# Patient Record
Sex: Female | Born: 1960 | Race: White | Hispanic: No | Marital: Married | Smoking: Never smoker
Health system: Southern US, Community
[De-identification: ages and names within clinical notes are randomized; demographics above are authoritative.]

## PROBLEM LIST (undated history)

## (undated) DIAGNOSIS — F429 Obsessive-compulsive disorder, unspecified: Secondary | ICD-10-CM

## (undated) DIAGNOSIS — I10 Essential (primary) hypertension: Secondary | ICD-10-CM

## (undated) DIAGNOSIS — R51 Headache: Secondary | ICD-10-CM

## (undated) DIAGNOSIS — F419 Anxiety disorder, unspecified: Secondary | ICD-10-CM

## (undated) DIAGNOSIS — E039 Hypothyroidism, unspecified: Secondary | ICD-10-CM

## (undated) DIAGNOSIS — K130 Diseases of lips: Secondary | ICD-10-CM

## (undated) DIAGNOSIS — R519 Headache, unspecified: Secondary | ICD-10-CM

## (undated) HISTORY — PX: SHOULDER ARTHROSCOPY: SHX128

## (undated) HISTORY — PX: APPENDECTOMY: SHX54

## (undated) HISTORY — PX: LIPOMA EXCISION: SHX5283

## (undated) HISTORY — PX: ABDOMINAL HYSTERECTOMY: SHX81

## (undated) SURGERY — EXCISION MASS
Anesthesia: General | Laterality: Right

---

## 2013-04-04 ENCOUNTER — Other Ambulatory Visit: Payer: Self-pay | Admitting: Family Medicine

## 2013-04-04 DIAGNOSIS — Z1231 Encounter for screening mammogram for malignant neoplasm of breast: Secondary | ICD-10-CM

## 2013-04-23 ENCOUNTER — Other Ambulatory Visit: Payer: Self-pay | Admitting: Gastroenterology

## 2013-04-23 ENCOUNTER — Ambulatory Visit
Admission: RE | Admit: 2013-04-23 | Discharge: 2013-04-23 | Disposition: A | Discharge status: Home / Self Care | Payer: BC Managed Care – PPO | Source: Ambulatory Visit | Attending: Gastroenterology | Admitting: Gastroenterology

## 2013-04-23 DIAGNOSIS — R109 Unspecified abdominal pain: Secondary | ICD-10-CM

## 2013-04-23 MED ORDER — IOHEXOL 300 MG/ML  SOLN
100.0000 mL | Freq: Once | INTRAMUSCULAR | Status: AC | PRN
  Administered 2013-04-23: 100 mL via INTRAVENOUS

## 2013-05-02 ENCOUNTER — Ambulatory Visit
Admission: RE | Admit: 2013-05-02 | Discharge: 2013-05-02 | Disposition: A | Discharge status: Home / Self Care | Payer: BC Managed Care – PPO | Source: Ambulatory Visit | Attending: Family Medicine | Admitting: Family Medicine

## 2013-05-02 DIAGNOSIS — Z1231 Encounter for screening mammogram for malignant neoplasm of breast: Secondary | ICD-10-CM

## 2014-08-08 ENCOUNTER — Other Ambulatory Visit: Payer: Self-pay

## 2014-08-08 DIAGNOSIS — Z1231 Encounter for screening mammogram for malignant neoplasm of breast: Secondary | ICD-10-CM

## 2014-09-02 ENCOUNTER — Ambulatory Visit
Admission: RE | Admit: 2014-09-02 | Discharge: 2014-09-02 | Disposition: A | Discharge status: Home / Self Care | Payer: BC Managed Care – PPO | Source: Ambulatory Visit

## 2014-09-02 DIAGNOSIS — Z1231 Encounter for screening mammogram for malignant neoplasm of breast: Secondary | ICD-10-CM

## 2015-10-13 ENCOUNTER — Other Ambulatory Visit: Payer: Self-pay

## 2015-10-13 DIAGNOSIS — Z1231 Encounter for screening mammogram for malignant neoplasm of breast: Secondary | ICD-10-CM

## 2015-11-06 ENCOUNTER — Ambulatory Visit
Admission: RE | Admit: 2015-11-06 | Discharge: 2015-11-06 | Disposition: A | Discharge status: Home / Self Care | Payer: BLUE CROSS/BLUE SHIELD | Source: Ambulatory Visit

## 2015-11-06 DIAGNOSIS — Z1231 Encounter for screening mammogram for malignant neoplasm of breast: Secondary | ICD-10-CM

## 2015-11-25 ENCOUNTER — Encounter (HOSPITAL_BASED_OUTPATIENT_CLINIC_OR_DEPARTMENT_OTHER): Payer: Self-pay | Admitting: *Deleted

## 2015-11-27 ENCOUNTER — Encounter (HOSPITAL_BASED_OUTPATIENT_CLINIC_OR_DEPARTMENT_OTHER)
Admission: RE | Admit: 2015-11-27 | Discharge: 2015-11-27 | Disposition: A | Discharge status: Home / Self Care | Payer: BLUE CROSS/BLUE SHIELD | Source: Ambulatory Visit | Attending: Specialist | Admitting: Specialist

## 2015-11-27 ENCOUNTER — Other Ambulatory Visit: Payer: Self-pay

## 2015-11-27 DIAGNOSIS — Z01812 Encounter for preprocedural laboratory examination: Secondary | ICD-10-CM | POA: Insufficient documentation

## 2015-11-27 DIAGNOSIS — Z01810 Encounter for preprocedural cardiovascular examination: Secondary | ICD-10-CM | POA: Diagnosis not present

## 2015-11-27 DIAGNOSIS — I10 Essential (primary) hypertension: Secondary | ICD-10-CM | POA: Diagnosis not present

## 2015-11-27 LAB — BASIC METABOLIC PANEL
ANION GAP: 11 (ref 5–15)
BUN: 7 mg/dL (ref 6–20)
CALCIUM: 9.9 mg/dL (ref 8.9–10.3)
CO2: 32 mmol/L (ref 22–32)
CREATININE: 0.86 mg/dL (ref 0.44–1.00)
Chloride: 99 mmol/L — ABNORMAL LOW (ref 101–111)
GFR calc Af Amer: >60 mL/min (ref >60)
GLUCOSE: 91 mg/dL (ref 65–99)
Potassium: 4 mmol/L (ref 3.5–5.1)
Sodium: 142 mmol/L (ref 135–145)

## 2015-11-28 ENCOUNTER — Ambulatory Visit: Payer: Self-pay | Admitting: Specialist

## 2015-12-01 ENCOUNTER — Ambulatory Visit (HOSPITAL_BASED_OUTPATIENT_CLINIC_OR_DEPARTMENT_OTHER): Payer: BLUE CROSS/BLUE SHIELD | Admitting: Anesthesiology

## 2015-12-01 ENCOUNTER — Encounter (HOSPITAL_BASED_OUTPATIENT_CLINIC_OR_DEPARTMENT_OTHER): Payer: Self-pay | Admitting: *Deleted

## 2015-12-01 ENCOUNTER — Ambulatory Visit (HOSPITAL_BASED_OUTPATIENT_CLINIC_OR_DEPARTMENT_OTHER)
Admission: RE | Admit: 2015-12-01 | Discharge: 2015-12-01 | Disposition: A | Discharge status: Home / Self Care | Payer: BLUE CROSS/BLUE SHIELD | Source: Ambulatory Visit | Attending: Specialist | Admitting: Specialist

## 2015-12-01 ENCOUNTER — Encounter (HOSPITAL_BASED_OUTPATIENT_CLINIC_OR_DEPARTMENT_OTHER)
Admission: RE | Disposition: A | Discharge status: Home / Self Care | Payer: Self-pay | Source: Ambulatory Visit | Attending: Specialist

## 2015-12-01 DIAGNOSIS — Q279 Congenital malformation of peripheral vascular system, unspecified: Secondary | ICD-10-CM | POA: Diagnosis present

## 2015-12-01 DIAGNOSIS — F419 Anxiety disorder, unspecified: Secondary | ICD-10-CM | POA: Insufficient documentation

## 2015-12-01 DIAGNOSIS — Z79899 Other long term (current) drug therapy: Secondary | ICD-10-CM | POA: Insufficient documentation

## 2015-12-01 DIAGNOSIS — Z90710 Acquired absence of both cervix and uterus: Secondary | ICD-10-CM | POA: Insufficient documentation

## 2015-12-01 DIAGNOSIS — E039 Hypothyroidism, unspecified: Secondary | ICD-10-CM | POA: Diagnosis not present

## 2015-12-01 DIAGNOSIS — R4681 Obsessive-compulsive behavior: Secondary | ICD-10-CM | POA: Insufficient documentation

## 2015-12-01 DIAGNOSIS — I10 Essential (primary) hypertension: Secondary | ICD-10-CM | POA: Diagnosis not present

## 2015-12-01 HISTORY — DX: Essential (primary) hypertension: I10

## 2015-12-01 HISTORY — DX: Diseases of lips: K13.0

## 2015-12-01 HISTORY — DX: Headache, unspecified: R51.9

## 2015-12-01 HISTORY — DX: Hypothyroidism, unspecified: E03.9

## 2015-12-01 HISTORY — DX: Anxiety disorder, unspecified: F41.9

## 2015-12-01 HISTORY — DX: Headache: R51

## 2015-12-01 HISTORY — PX: LESION REMOVAL: SHX5196

## 2015-12-01 HISTORY — DX: Obsessive-compulsive disorder, unspecified: F42.9

## 2015-12-01 SURGERY — WIDE EXCISION, LESION, UPPER EXTREMITY
Anesthesia: General | Laterality: Right

## 2015-12-01 MED ORDER — FENTANYL CITRATE (PF) 100 MCG/2ML IJ SOLN
INTRAMUSCULAR | Status: AC
Start: 2015-12-01 — End: 2015-12-01
  Filled 2015-12-01: qty 2

## 2015-12-01 MED ORDER — GLYCOPYRROLATE 0.2 MG/ML IJ SOLN
INTRAMUSCULAR | Status: AC
  Filled 2015-12-01: qty 1

## 2015-12-01 MED ORDER — DEXAMETHASONE SODIUM PHOSPHATE 10 MG/ML IJ SOLN
INTRAMUSCULAR | Status: AC
  Filled 2015-12-01: qty 1

## 2015-12-01 MED ORDER — LIDOCAINE-EPINEPHRINE 0.5 %-1:200000 IJ SOLN
INTRAMUSCULAR | Status: DC | PRN
  Administered 2015-12-01: 5 mL

## 2015-12-01 MED ORDER — SCOPOLAMINE 1 MG/3DAYS TD PT72: 1.0000 | MEDICATED_PATCH | Freq: Once | TRANSDERMAL | Status: DC | PRN

## 2015-12-01 MED ORDER — MIDAZOLAM HCL 2 MG/2ML IJ SOLN
INTRAMUSCULAR | Status: AC
  Filled 2015-12-01: qty 2

## 2015-12-01 MED ORDER — PROPOFOL 10 MG/ML IV BOLUS
INTRAVENOUS | Status: DC | PRN
  Administered 2015-12-01: 120 mg via INTRAVENOUS

## 2015-12-01 MED ORDER — GLYCOPYRROLATE 0.2 MG/ML IJ SOLN
0.2000 mg | Freq: Once | INTRAMUSCULAR | Status: AC | PRN
  Administered 2015-12-01: 0.2 mg via INTRAVENOUS

## 2015-12-01 MED ORDER — ONDANSETRON HCL 4 MG/2ML IJ SOLN
INTRAMUSCULAR | Status: DC | PRN
  Administered 2015-12-01: 4 mg via INTRAVENOUS

## 2015-12-01 MED ORDER — EPHEDRINE SULFATE 50 MG/ML IJ SOLN
INTRAMUSCULAR | Status: AC
  Filled 2015-12-01: qty 1

## 2015-12-01 MED ORDER — FENTANYL CITRATE (PF) 100 MCG/2ML IJ SOLN
50.0000 ug | INTRAMUSCULAR | Status: DC | PRN
  Administered 2015-12-01: 100 ug via INTRAVENOUS

## 2015-12-01 MED ORDER — CEFAZOLIN SODIUM-DEXTROSE 2-3 GM-% IV SOLR
2.0000 g | INTRAVENOUS | Status: AC
  Administered 2015-12-01: 2 g via INTRAVENOUS

## 2015-12-01 MED ORDER — HYDROMORPHONE HCL 1 MG/ML IJ SOLN
0.2500 mg | INTRAMUSCULAR | Status: DC | PRN
Start: 2015-12-01 — End: 2015-12-01

## 2015-12-01 MED ORDER — CEFAZOLIN SODIUM-DEXTROSE 2-3 GM-% IV SOLR
INTRAVENOUS | Status: AC
Start: 2015-12-01 — End: 2015-12-01
  Filled 2015-12-01: qty 50

## 2015-12-01 MED ORDER — SUCCINYLCHOLINE CHLORIDE 20 MG/ML IJ SOLN
INTRAMUSCULAR | Status: DC | PRN
  Administered 2015-12-01: 80 mg via INTRAVENOUS

## 2015-12-01 MED ORDER — PROPOFOL 10 MG/ML IV BOLUS
INTRAVENOUS | Status: AC
  Filled 2015-12-01: qty 20

## 2015-12-01 MED ORDER — LACTATED RINGERS IV SOLN
INTRAVENOUS | Status: DC
  Administered 2015-12-01: 09:00:00 via INTRAVENOUS

## 2015-12-01 MED ORDER — DEXAMETHASONE SODIUM PHOSPHATE 4 MG/ML IJ SOLN
INTRAMUSCULAR | Status: DC | PRN
  Administered 2015-12-01: 10 mg via INTRAVENOUS

## 2015-12-01 MED ORDER — ONDANSETRON HCL 4 MG/2ML IJ SOLN
INTRAMUSCULAR | Status: AC
  Filled 2015-12-01: qty 2

## 2015-12-01 MED ORDER — MIDAZOLAM HCL 2 MG/2ML IJ SOLN
1.0000 mg | INTRAMUSCULAR | Status: DC | PRN
  Administered 2015-12-01: 2 mg via INTRAVENOUS

## 2015-12-01 MED ORDER — LIDOCAINE HCL (CARDIAC) 20 MG/ML IV SOLN
INTRAVENOUS | Status: AC
  Filled 2015-12-01: qty 5

## 2015-12-01 MED ORDER — LIDOCAINE HCL (CARDIAC) 20 MG/ML IV SOLN
INTRAVENOUS | Status: DC | PRN
  Administered 2015-12-01: 50 mg via INTRAVENOUS

## 2015-12-01 MED ORDER — EPHEDRINE SULFATE 50 MG/ML IJ SOLN
INTRAMUSCULAR | Status: DC | PRN
  Administered 2015-12-01 (×3): 10 mg via INTRAVENOUS

## 2015-12-01 SURGICAL SUPPLY — 49 items
BAG DECANTER FOR FLEXI CONT (MISCELLANEOUS) ×3 IMPLANT
BLADE HEX COATED 2.75 (ELECTRODE) IMPLANT
BLADE KNIFE PERSONA 10 (BLADE) IMPLANT
BLADE KNIFE PERSONA 15 (BLADE) ×3 IMPLANT
BNDG GAUZE ELAST 4 BULKY (GAUZE/BANDAGES/DRESSINGS) IMPLANT
CANISTER SUCT 1200ML W/VALVE (MISCELLANEOUS) IMPLANT
COVER BACK TABLE 60X90IN (DRAPES) ×3 IMPLANT
COVER MAYO STAND STRL (DRAPES) ×3 IMPLANT
DECANTER SPIKE VIAL GLASS SM (MISCELLANEOUS) IMPLANT
DRAPE LAPAROSCOPIC ABDOMINAL (DRAPES) IMPLANT
DRAPE U-SHAPE 76X120 STRL (DRAPES) ×3 IMPLANT
DRSG PAD ABDOMINAL 8X10 ST (GAUZE/BANDAGES/DRESSINGS) IMPLANT
ELECT REM PT RETURN 9FT ADLT (ELECTROSURGICAL) ×3
ELECTRODE REM PT RTRN 9FT ADLT (ELECTROSURGICAL) ×1 IMPLANT
GAUZE SPONGE 4X4 12PLY STRL (GAUZE/BANDAGES/DRESSINGS) IMPLANT
GAUZE XEROFORM 1X8 LF (GAUZE/BANDAGES/DRESSINGS) IMPLANT
GAUZE XEROFORM 5X9 LF (GAUZE/BANDAGES/DRESSINGS) IMPLANT
GLOVE BIO SURGEON STRL SZ 6.5 (GLOVE) ×2 IMPLANT
GLOVE BIO SURGEONS STRL SZ 6.5 (GLOVE) ×1
GLOVE BIOGEL M STRL SZ7.5 (GLOVE) ×3 IMPLANT
GLOVE BIOGEL PI IND STRL 8 (GLOVE) ×1 IMPLANT
GLOVE BIOGEL PI INDICATOR 8 (GLOVE) ×2
GLOVE ECLIPSE 7.0 STRL STRAW (GLOVE) ×3 IMPLANT
GOWN STRL REUS W/ TWL LRG LVL3 (GOWN DISPOSABLE) IMPLANT
GOWN STRL REUS W/ TWL XL LVL3 (GOWN DISPOSABLE) ×2 IMPLANT
GOWN STRL REUS W/TWL LRG LVL3 (GOWN DISPOSABLE)
GOWN STRL REUS W/TWL XL LVL3 (GOWN DISPOSABLE) ×4
NEEDLE HYPO 25X1 1.5 SAFETY (NEEDLE) ×3 IMPLANT
NEEDLE SPNL 18GX3.5 QUINCKE PK (NEEDLE) IMPLANT
NS IRRIG 1000ML POUR BTL (IV SOLUTION) ×3 IMPLANT
PACK BASIN DAY SURGERY FS (CUSTOM PROCEDURE TRAY) ×3 IMPLANT
PENCIL BUTTON HOLSTER BLD 10FT (ELECTRODE) ×3 IMPLANT
SHEET MEDIUM DRAPE 40X70 STRL (DRAPES) IMPLANT
SLEEVE SCD COMPRESS KNEE MED (MISCELLANEOUS) ×3 IMPLANT
SPONGE GAUZE 4X4 12PLY STER LF (GAUZE/BANDAGES/DRESSINGS) IMPLANT
SPONGE LAP 18X18 X RAY DECT (DISPOSABLE) ×3 IMPLANT
STRIP SUTURE WOUND CLOSURE 1/2 (SUTURE) IMPLANT
SUCTION FRAZIER HANDLE 10FR (MISCELLANEOUS) ×2
SUCTION TUBE FRAZIER 10FR DISP (MISCELLANEOUS) ×1 IMPLANT
SUT ETHILON 5 0 PS 2 18 (SUTURE) ×3 IMPLANT
SUT ETHILON 6 0 PS 3 18 (SUTURE) ×3 IMPLANT
SUT MON AB 5-0 PS2 18 (SUTURE) ×3 IMPLANT
SYR 20CC LL (SYRINGE) ×3 IMPLANT
SYR 50ML LL SCALE MARK (SYRINGE) IMPLANT
SYR CONTROL 10ML LL (SYRINGE) IMPLANT
TOWEL OR 17X24 6PK STRL BLUE (TOWEL DISPOSABLE) ×6 IMPLANT
TUBE CONNECTING 20'X1/4 (TUBING) ×1
TUBE CONNECTING 20X1/4 (TUBING) ×2 IMPLANT
VAC PENCILS W/TUBING CLEAR (MISCELLANEOUS) IMPLANT

## 2015-12-01 NOTE — Anesthesia Preprocedure Evaluation (Addendum)
Anesthesia Evaluation  Patient identified by MRN, date of birth, ID band Patient awake    Reviewed: Allergy & Precautions, H&P , NPO status , Patient's Chart, lab work & pertinent test results  Airway Mallampati: I  TM Distance: >3 FB     Dental no notable dental hx. (+) Teeth Intact, Dental Advisory Given   Pulmonary neg pulmonary ROS,    Pulmonary exam normal breath sounds clear to auscultation       Cardiovascular hypertension, Pt. on medications  Rhythm:Regular Rate:Normal     Neuro/Psych  Headaches, Anxiety    GI/Hepatic negative GI ROS, Neg liver ROS,   Endo/Other  Hypothyroidism   Renal/GU negative Renal ROS  negative genitourinary   Musculoskeletal   Abdominal   Peds  Hematology negative hematology ROS (+)   Anesthesia Other Findings   Reproductive/Obstetrics negative OB ROS                            Anesthesia Physical Anesthesia Plan  ASA: II  Anesthesia Plan: General   Post-op Pain Management:    Induction: Intravenous  Airway Management Planned: Oral ETT  Additional Equipment:   Intra-op Plan:   Post-operative Plan: Extubation in OR  Informed Consent: I have reviewed the patients History and Physical, chart, labs and discussed the procedure including the risks, benefits and alternatives for the proposed anesthesia with the patient or authorized representative who has indicated his/her understanding and acceptance.   Dental advisory given  Plan Discussed with: CRNA  Anesthesia Plan Comments:         Anesthesia Quick Evaluation

## 2015-12-01 NOTE — Brief Op Note (Signed)
12/01/2015  10:14 AM  PATIENT:  Destiny Bennett  55 y.o. female  PRE-OPERATIVE DIAGNOSIS:  LESION RIGHT LOWER LIP  POST-OPERATIVE DIAGNOSIS:  LESION RIGHT LOWER LIP  PROCEDURE:  Procedure(s) with comments: EXCISION LESION RIGHT LOWER LIP (Right) - right lower lip   SURGEON:  Surgeon(s) and Role:    * Louisa SecondGerald Jailine Lieder, MD - Primary  PHYSICIAN ASSISTANT:   ASSISTANTS: none   ANESTHESIA:   general  EBL:  Total I/O In: 1500 [I.V.:1500] Out: 5 [Blood:5]  BLOOD ADMINISTERED:none  DRAINS: none   LOCAL MEDICATIONS USED:  LIDOCAINE   SPECIMEN:  Excision  DISPOSITION OF SPECIMEN:  PATHOLOGY  COUNTS:  YES  TOURNIQUET:  * No tourniquets in log *  DICTATION: .Other Dictation: Dictation Number [id number]  PLAN OF CARE: Discharge to home after PACU  PATIENT DISPOSITION:  PACU - hemodynamically stable.   Delay start of Pharmacological VTE agent (>24hrs) due to surgical blood loss or risk of bleeding: yes

## 2015-12-01 NOTE — Anesthesia Procedure Notes (Signed)
Procedure Name: Intubation Performed by: Bruk Tumolo W Pre-anesthesia Checklist: Patient identified, Emergency Drugs available, Suction available and Patient being monitored Patient Re-evaluated:Patient Re-evaluated prior to inductionOxygen Delivery Method: Circle System Utilized Preoxygenation: Pre-oxygenation with 100% oxygen Intubation Type: IV induction Ventilation: Mask ventilation without difficulty Laryngoscope Size: Miller and 2 Grade View: Grade I Tube type: Oral Number of attempts: 1 Airway Equipment and Method: Stylet and Oral airway Placement Confirmation: ETT inserted through vocal cords under direct vision,  positive ETCO2 and breath sounds checked- equal and bilateral Secured at: 22 cm Tube secured with: Tape Dental Injury: Teeth and Oropharynx as per pre-operative assessment      

## 2015-12-01 NOTE — Transfer of Care (Signed)
Immediate Anesthesia Transfer of Care Note  Patient: Destiny KirschnerJana Bennett  Procedure(s) Performed: Procedure(s) with comments: EXCISION LESION RIGHT LOWER LIP (Right) - right lower lip   Patient Location: PACU  Anesthesia Type:General  Level of Consciousness: awake and sedated  Airway & Oxygen Therapy: Patient Spontanous Breathing and Patient connected to face mask oxygen  Post-op Assessment: Report given to RN and Post -op Vital signs reviewed and stable  Post vital signs: Reviewed and stable  Last Vitals:  Filed Vitals:   12/01/15 0839  BP: 146/75  Pulse: 60  Temp: 36.8 C  Resp: 20    Complications: No apparent anesthesia complications

## 2015-12-01 NOTE — Discharge Instructions (Signed)
Activity (include date of return to work if known) °As tolerated: NO showers °NO driving °No heavy activities ° °Diet:regular No restrictions: ° °Wound Care: Keep dressing clean & dry ° °Do not change dressings °For Abdominoplasties wear abdominal binder °Special Instructions: °Do not raise arms up °Continue to empty, recharge, & record drainage from J-P drains &/or °Hemovacs 2-3 times a day, as needed. °Call Doctor if any unusual problems occur such as pain, excessive °Bleeding, unrelieved Nausea/vomiting, Fever &/or chills °When lying down, keep head elevated on 2-3 pillows or back-rest °For Addominoplasties the Jack-knife position °Follow-up appointment: Please call the office. ° °The patient received discharge instruction from:___________________________________________ ° ° °Patient signature ________________________________________ / Date___________ ° ° ° °Signature of individual providing instructions/ Date________________     ° ° °Post Anesthesia Home Care Instructions ° °Activity: °Get plenty of rest for the remainder of the day. A responsible adult should stay with you for 24 hours following the procedure.  °For the next 24 hours, DO NOT: °-Drive a car °-Operate machinery °-Drink alcoholic beverages °-Take any medication unless instructed by your physician °-Make any legal decisions or sign important papers. ° °Meals: °Start with liquid foods such as gelatin or soup. Progress to regular foods as tolerated. Avoid greasy, spicy, heavy foods. If nausea and/or vomiting occur, drink only clear liquids until the nausea and/or vomiting subsides. Call your physician if vomiting continues. ° °Special Instructions/Symptoms: °Your throat may feel dry or sore from the anesthesia or the breathing tube placed in your throat during surgery. If this causes discomfort, gargle with warm salt water. The discomfort should disappear within 24 hours. ° °If you had a scopolamine patch placed behind your ear for the management of  post- operative nausea and/or vomiting: ° °1. The medication in the patch is effective for 72 hours, after which it should be removed.  Wrap patch in a tissue and discard in the trash. Wash hands thoroughly with soap and water. °2. You may remove the patch earlier than 72 hours if you experience unpleasant side effects which may include dry mouth, dizziness or visual disturbances. °3. Avoid touching the patch. Wash your hands with soap and water after contact with the patch. °  °         °

## 2015-12-01 NOTE — Anesthesia Postprocedure Evaluation (Signed)
Anesthesia Post Note  Patient: Destiny Bennett  Procedure(s) Performed: Procedure(s) (LRB): EXCISION LESION RIGHT LOWER LIP (Right)  Patient location during evaluation: PACU Anesthesia Type: General Level of consciousness: awake and alert Pain management: pain level controlled Vital Signs Assessment: post-procedure vital signs reviewed and stable Respiratory status: spontaneous breathing, nonlabored ventilation and respiratory function stable Cardiovascular status: blood pressure returned to baseline and stable Postop Assessment: no signs of nausea or vomiting Anesthetic complications: no    Last Vitals:  Filed Vitals:   12/01/15 1104 12/01/15 1145  BP:  143/86  Pulse: 95 92  Temp:  36 C  Resp: 16 14    Last Pain: There were no vitals filed for this visit.               Luvern Mcisaac,W. EDMOND

## 2015-12-01 NOTE — H&P (Signed)
Destiny KirschnerJana Bennett is an 55 y.o. female.   Chief Complaint: Vascular lesion to the right lower lip HPI: Increased growth and vascularization  Has also bled in the past  Past Medical History  Diagnosis Date  . Hypertension   . Hypothyroidism   . Anxiety   . OCD (obsessive compulsive disorder)   . Headache   . Lesion of lip     right lower lip    Past Surgical History  Procedure Laterality Date  . Abdominal hysterectomy    . Cesarean section      x2  . Appendectomy    . Shoulder arthroscopy Left   . Lipoma excision      left elbow    History reviewed. No pertinent family history. Social History:  reports that she has never smoked. She does not have any smokeless tobacco history on file. She reports that she drinks alcohol. She reports that she does not use illicit drugs.  Allergies: No Known Allergies  Medications Prior to Admission  Medication Sig Dispense Refill  . Biotin 10 MG CAPS Take by mouth.    . citalopram (CELEXA) 40 MG tablet Take 40 mg by mouth daily.    . hydrochlorothiazide (MICROZIDE) 12.5 MG capsule Take 12.5 mg by mouth daily.    Marland Kitchen. levothyroxine (SYNTHROID, LEVOTHROID) 50 MCG tablet Take 50 mcg by mouth daily before breakfast.    . Multiple Vitamin (MULTIVITAMIN WITH MINERALS) TABS tablet Take 1 tablet by mouth daily.    . temazepam (RESTORIL) 30 MG capsule Take 30 mg by mouth at bedtime as needed for sleep.    . SUMAtriptan (IMITREX) 50 MG tablet Take 50 mg by mouth every 2 (two) hours as needed for migraine. May repeat in 2 hours if headache persists or recurs.      No results found for this or any previous visit (from the past 48 hour(s)). No results found.  Review of Systems  Constitutional: Negative.   HENT: Negative.   Eyes: Negative.   Respiratory: Negative.   Cardiovascular: Negative.   Gastrointestinal: Negative.   Genitourinary: Negative.   Musculoskeletal: Negative.   Skin: Negative.   Neurological: Negative.   Endo/Heme/Allergies: Negative.    Psychiatric/Behavioral: Negative.     Blood pressure 146/75, pulse 60, temperature 98.2 F (36.8 C), temperature source Oral, resp. rate 20, height 5\' 6"  (1.676 m), weight 63.504 kg (140 lb), SpO2 99 %. Physical Exam   Assessment/Plan Vascular lesion right lower lip rule out AV malformation or hemangioma  Ediberto Sens L, MD 12/01/2015, 9:00 AM

## 2015-12-02 ENCOUNTER — Encounter (HOSPITAL_BASED_OUTPATIENT_CLINIC_OR_DEPARTMENT_OTHER): Payer: Self-pay | Admitting: Specialist

## 2015-12-02 NOTE — Op Note (Signed)
NAMElmon Kirschner:  Hoheisel, Breanne                ACCOUNT NO.:  [id number]  MEDICAL RECORD NO.:  [id number]  LOCATION:                                 FACILITY:  PHYSICIAN:  Earvin HansenGerald L. 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  PROCEDURES DONE:  Excision, flap reconstruction of the area.  ANESTHESIA:  General.  Preoperatively, the patient underwent drawings of the areas to include the mucosa through the vermillion border.  She underwent general anesthesia, intubated orally.  Prep was done to the face with Betadine solution, walled off with sterile towels and drapes so as to make a sterile field.  Marking pen was used to outline the region, had to go below the vermillion border, approximately 2 mm of 0.5% of Xylocaine with epinephrine was injected locally.  Total of 6 mL 1:100,000 concentration.  This was allowed to set up and then elliptical collared construction incision was made down to underlying brachioradialis oculus with proper margins of the lesion.  After this, hemostasis was maintained with the Bovie anticoagulation at 20 __________.  Next, the brachioradialis was reclosed back with deep sutures of 5-0 Monocryl, subcutaneous closed on a 5-0 Monocryl throughout the deep subcutaneous areas.  Skin was reapproximated with multiple sutures of 6-0 nylon throughout the area.  Small vascular lesion just to the midline of the lesion was then Bovie'd with Bovie anticoagulation x1 pass.  All this seemed to be a very superficial and surface area and location.  The wounds were cleansed.  Steri-Strips and soft dressings were applied to all the areas.  She withstood the procedures very well, and was taken to recovery in excellent  condition.     Yaakov GuthrieGerald L. Shon Houghruesdale, M.D.     Cathie HoopsGLT/MEDQ  D:  12/01/2015  T:  12/01/2015  Job:  [id number]

## 2020-06-09 DIAGNOSIS — T300 Burn of unspecified body region, unspecified degree: Secondary | ICD-10-CM | POA: Diagnosis not present

## 2020-06-23 DIAGNOSIS — T300 Burn of unspecified body region, unspecified degree: Secondary | ICD-10-CM | POA: Diagnosis not present

## 2020-06-23 DIAGNOSIS — F5101 Primary insomnia: Secondary | ICD-10-CM | POA: Diagnosis not present

## 2020-07-10 DIAGNOSIS — S52501A Unspecified fracture of the lower end of right radius, initial encounter for closed fracture: Secondary | ICD-10-CM | POA: Diagnosis not present

## 2020-07-10 DIAGNOSIS — S6991XA Unspecified injury of right wrist, hand and finger(s), initial encounter: Secondary | ICD-10-CM | POA: Diagnosis not present

## 2020-07-15 DIAGNOSIS — S52501D Unspecified fracture of the lower end of right radius, subsequent encounter for closed fracture with routine healing: Secondary | ICD-10-CM | POA: Diagnosis not present

## 2020-07-25 DIAGNOSIS — Z79899 Other long term (current) drug therapy: Secondary | ICD-10-CM | POA: Diagnosis not present

## 2020-07-25 DIAGNOSIS — E039 Hypothyroidism, unspecified: Secondary | ICD-10-CM | POA: Diagnosis not present

## 2020-07-25 DIAGNOSIS — Z20828 Contact with and (suspected) exposure to other viral communicable diseases: Secondary | ICD-10-CM | POA: Diagnosis not present

## 2020-07-25 DIAGNOSIS — I10 Essential (primary) hypertension: Secondary | ICD-10-CM | POA: Diagnosis not present

## 2020-07-25 DIAGNOSIS — E782 Mixed hyperlipidemia: Secondary | ICD-10-CM | POA: Diagnosis not present

## 2020-08-01 DIAGNOSIS — E782 Mixed hyperlipidemia: Secondary | ICD-10-CM | POA: Diagnosis not present

## 2020-08-01 DIAGNOSIS — I10 Essential (primary) hypertension: Secondary | ICD-10-CM | POA: Diagnosis not present

## 2020-08-01 DIAGNOSIS — E039 Hypothyroidism, unspecified: Secondary | ICD-10-CM | POA: Diagnosis not present

## 2020-08-06 DIAGNOSIS — S52501D Unspecified fracture of the lower end of right radius, subsequent encounter for closed fracture with routine healing: Secondary | ICD-10-CM | POA: Diagnosis not present

## 2020-08-28 DIAGNOSIS — S52501D Unspecified fracture of the lower end of right radius, subsequent encounter for closed fracture with routine healing: Secondary | ICD-10-CM | POA: Diagnosis not present

## 2020-10-01 DIAGNOSIS — Z20822 Contact with and (suspected) exposure to covid-19: Secondary | ICD-10-CM | POA: Diagnosis not present

## 2021-01-15 DIAGNOSIS — F5101 Primary insomnia: Secondary | ICD-10-CM | POA: Diagnosis not present

## 2021-01-15 DIAGNOSIS — F3342 Major depressive disorder, recurrent, in full remission: Secondary | ICD-10-CM | POA: Diagnosis not present

## 2021-01-15 DIAGNOSIS — F418 Other specified anxiety disorders: Secondary | ICD-10-CM | POA: Diagnosis not present

## 2021-01-30 DIAGNOSIS — Z79899 Other long term (current) drug therapy: Secondary | ICD-10-CM | POA: Diagnosis not present

## 2021-01-30 DIAGNOSIS — E785 Hyperlipidemia, unspecified: Secondary | ICD-10-CM | POA: Diagnosis not present

## 2021-01-30 DIAGNOSIS — I10 Essential (primary) hypertension: Secondary | ICD-10-CM | POA: Diagnosis not present

## 2021-01-30 DIAGNOSIS — E039 Hypothyroidism, unspecified: Secondary | ICD-10-CM | POA: Diagnosis not present

## 2021-02-06 DIAGNOSIS — F418 Other specified anxiety disorders: Secondary | ICD-10-CM | POA: Diagnosis not present

## 2021-02-06 DIAGNOSIS — E782 Mixed hyperlipidemia: Secondary | ICD-10-CM | POA: Diagnosis not present

## 2021-02-06 DIAGNOSIS — I10 Essential (primary) hypertension: Secondary | ICD-10-CM | POA: Diagnosis not present

## 2021-02-06 DIAGNOSIS — E039 Hypothyroidism, unspecified: Secondary | ICD-10-CM | POA: Diagnosis not present

## 2021-07-24 DIAGNOSIS — Z1159 Encounter for screening for other viral diseases: Secondary | ICD-10-CM | POA: Diagnosis not present

## 2021-07-24 DIAGNOSIS — E039 Hypothyroidism, unspecified: Secondary | ICD-10-CM | POA: Diagnosis not present

## 2021-07-24 DIAGNOSIS — I10 Essential (primary) hypertension: Secondary | ICD-10-CM | POA: Diagnosis not present

## 2021-07-24 DIAGNOSIS — Z79899 Other long term (current) drug therapy: Secondary | ICD-10-CM | POA: Diagnosis not present

## 2021-07-24 DIAGNOSIS — E785 Hyperlipidemia, unspecified: Secondary | ICD-10-CM | POA: Diagnosis not present

## 2021-08-12 DIAGNOSIS — F418 Other specified anxiety disorders: Secondary | ICD-10-CM | POA: Diagnosis not present

## 2021-08-12 DIAGNOSIS — E782 Mixed hyperlipidemia: Secondary | ICD-10-CM | POA: Diagnosis not present

## 2021-08-12 DIAGNOSIS — I10 Essential (primary) hypertension: Secondary | ICD-10-CM | POA: Diagnosis not present

## 2021-08-12 DIAGNOSIS — E039 Hypothyroidism, unspecified: Secondary | ICD-10-CM | POA: Diagnosis not present

## 2021-08-12 DIAGNOSIS — R110 Nausea: Secondary | ICD-10-CM | POA: Diagnosis not present

## 2021-09-02 DIAGNOSIS — M545 Low back pain, unspecified: Secondary | ICD-10-CM | POA: Diagnosis not present

## 2021-10-13 DIAGNOSIS — M7671 Peroneal tendinitis, right leg: Secondary | ICD-10-CM | POA: Diagnosis not present

## 2021-11-25 DIAGNOSIS — I10 Essential (primary) hypertension: Secondary | ICD-10-CM | POA: Diagnosis not present

## 2021-11-25 DIAGNOSIS — F3342 Major depressive disorder, recurrent, in full remission: Secondary | ICD-10-CM | POA: Diagnosis not present

## 2021-11-25 DIAGNOSIS — G4701 Insomnia due to medical condition: Secondary | ICD-10-CM | POA: Diagnosis not present

## 2021-12-04 ENCOUNTER — Emergency Department (HOSPITAL_COMMUNITY): Payer: BC Managed Care – PPO

## 2021-12-04 ENCOUNTER — Encounter (HOSPITAL_COMMUNITY): Payer: Self-pay

## 2021-12-04 ENCOUNTER — Inpatient Hospital Stay (HOSPITAL_COMMUNITY)
Admission: EM | Admit: 2021-12-04 | Discharge: 2021-12-09 | DRG: 460 | Disposition: A | Discharge status: Home / Self Care | Payer: BC Managed Care – PPO | Attending: Neurosurgery | Admitting: Neurosurgery

## 2021-12-04 ENCOUNTER — Other Ambulatory Visit: Payer: Self-pay

## 2021-12-04 DIAGNOSIS — F429 Obsessive-compulsive disorder, unspecified: Secondary | ICD-10-CM | POA: Diagnosis present

## 2021-12-04 DIAGNOSIS — Y92410 Unspecified street and highway as the place of occurrence of the external cause: Secondary | ICD-10-CM | POA: Diagnosis not present

## 2021-12-04 DIAGNOSIS — R519 Headache, unspecified: Secondary | ICD-10-CM | POA: Diagnosis not present

## 2021-12-04 DIAGNOSIS — S0990XA Unspecified injury of head, initial encounter: Secondary | ICD-10-CM | POA: Diagnosis not present

## 2021-12-04 DIAGNOSIS — S199XXA Unspecified injury of neck, initial encounter: Secondary | ICD-10-CM | POA: Diagnosis not present

## 2021-12-04 DIAGNOSIS — R339 Retention of urine, unspecified: Secondary | ICD-10-CM | POA: Diagnosis present

## 2021-12-04 DIAGNOSIS — S22009A Unspecified fracture of unspecified thoracic vertebra, initial encounter for closed fracture: Secondary | ICD-10-CM | POA: Diagnosis not present

## 2021-12-04 DIAGNOSIS — Z79890 Hormone replacement therapy (postmenopausal): Secondary | ICD-10-CM

## 2021-12-04 DIAGNOSIS — I700 Atherosclerosis of aorta: Secondary | ICD-10-CM | POA: Diagnosis not present

## 2021-12-04 DIAGNOSIS — S22008A Other fracture of unspecified thoracic vertebra, initial encounter for closed fracture: Secondary | ICD-10-CM

## 2021-12-04 DIAGNOSIS — S32011A Stable burst fracture of first lumbar vertebra, initial encounter for closed fracture: Secondary | ICD-10-CM | POA: Diagnosis not present

## 2021-12-04 DIAGNOSIS — I10 Essential (primary) hypertension: Secondary | ICD-10-CM | POA: Diagnosis present

## 2021-12-04 DIAGNOSIS — Z20822 Contact with and (suspected) exposure to covid-19: Secondary | ICD-10-CM | POA: Diagnosis present

## 2021-12-04 DIAGNOSIS — E039 Hypothyroidism, unspecified: Secondary | ICD-10-CM | POA: Diagnosis present

## 2021-12-04 DIAGNOSIS — F419 Anxiety disorder, unspecified: Secondary | ICD-10-CM | POA: Diagnosis present

## 2021-12-04 DIAGNOSIS — Z041 Encounter for examination and observation following transport accident: Secondary | ICD-10-CM | POA: Diagnosis not present

## 2021-12-04 DIAGNOSIS — M4325 Fusion of spine, thoracolumbar region: Secondary | ICD-10-CM | POA: Diagnosis not present

## 2021-12-04 DIAGNOSIS — J9811 Atelectasis: Secondary | ICD-10-CM | POA: Diagnosis not present

## 2021-12-04 DIAGNOSIS — E876 Hypokalemia: Secondary | ICD-10-CM | POA: Diagnosis not present

## 2021-12-04 DIAGNOSIS — S22089A Unspecified fracture of T11-T12 vertebra, initial encounter for closed fracture: Secondary | ICD-10-CM | POA: Diagnosis not present

## 2021-12-04 DIAGNOSIS — S32001A Stable burst fracture of unspecified lumbar vertebra, initial encounter for closed fracture: Secondary | ICD-10-CM | POA: Diagnosis not present

## 2021-12-04 DIAGNOSIS — S32010A Wedge compression fracture of first lumbar vertebra, initial encounter for closed fracture: Principal | ICD-10-CM

## 2021-12-04 DIAGNOSIS — Z981 Arthrodesis status: Secondary | ICD-10-CM | POA: Diagnosis not present

## 2021-12-04 DIAGNOSIS — T1490XA Injury, unspecified, initial encounter: Secondary | ICD-10-CM

## 2021-12-04 DIAGNOSIS — R52 Pain, unspecified: Secondary | ICD-10-CM

## 2021-12-04 LAB — COMPREHENSIVE METABOLIC PANEL
ALT: 19 U/L (ref 0–44)
AST: 26 U/L (ref 15–41)
Albumin: 4.1 g/dL (ref 3.5–5.0)
Alkaline Phosphatase: 80 U/L (ref 38–126)
Anion gap: 13 (ref 5–15)
BUN: 8 mg/dL (ref 6–20)
CO2: 22 mmol/L (ref 22–32)
Calcium: 8.9 mg/dL (ref 8.9–10.3)
Chloride: 103 mmol/L (ref 98–111)
Creatinine, Ser: 0.85 mg/dL (ref 0.44–1.00)
GFR, Estimated: >60 mL/min (ref >60)
Glucose, Bld: 134 mg/dL — ABNORMAL HIGH (ref 70–99)
Potassium: 2.6 mmol/L — CL (ref 3.5–5.1)
Sodium: 138 mmol/L (ref 135–145)
Total Bilirubin: 1.2 mg/dL (ref 0.3–1.2)
Total Protein: 7.1 g/dL (ref 6.5–8.1)

## 2021-12-04 LAB — ETHANOL: Alcohol, Ethyl (B): 44 mg/dL — ABNORMAL HIGH (ref <10)

## 2021-12-04 LAB — CBC
HCT: 38.8 % (ref 36.0–46.0)
Hemoglobin: 13.3 g/dL (ref 12.0–15.0)
MCH: 30.4 pg (ref 26.0–34.0)
MCHC: 34.3 g/dL (ref 30.0–36.0)
MCV: 88.6 fL (ref 80.0–100.0)
Platelets: 239 10*3/uL (ref 150–400)
RBC: 4.38 MIL/uL (ref 3.87–5.11)
RDW: 12.7 % (ref 11.5–15.5)
WBC: 12.6 10*3/uL — ABNORMAL HIGH (ref 4.0–10.5)
nRBC: 0 % (ref 0.0–0.2)

## 2021-12-04 MED ORDER — FENTANYL CITRATE PF 50 MCG/ML IJ SOSY
50.0000 ug | PREFILLED_SYRINGE | Freq: Once | INTRAMUSCULAR | Status: AC
  Administered 2021-12-04: 50 ug via INTRAVENOUS
  Filled 2021-12-04: qty 1

## 2021-12-04 MED ORDER — POTASSIUM CHLORIDE 10 MEQ/100ML IV SOLN
10.0000 meq | INTRAVENOUS | Status: DC
  Administered 2021-12-04 – 2021-12-05 (×4): 10 meq via INTRAVENOUS
  Filled 2021-12-04 (×4): qty 100
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
## 2021-12-04 NOTE — ED Notes (Signed)
Dr Fredricka Bonine with Trauma Surgery paged to [facility] ?
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
## 2021-12-05 DIAGNOSIS — F419 Anxiety disorder, unspecified: Secondary | ICD-10-CM | POA: Diagnosis present

## 2021-12-05 DIAGNOSIS — F429 Obsessive-compulsive disorder, unspecified: Secondary | ICD-10-CM | POA: Diagnosis present

## 2021-12-05 DIAGNOSIS — R519 Headache, unspecified: Secondary | ICD-10-CM | POA: Diagnosis present

## 2021-12-05 DIAGNOSIS — Y92410 Unspecified street and highway as the place of occurrence of the external cause: Secondary | ICD-10-CM | POA: Diagnosis not present

## 2021-12-05 DIAGNOSIS — S32011A Stable burst fracture of first lumbar vertebra, initial encounter for closed fracture: Secondary | ICD-10-CM | POA: Diagnosis present

## 2021-12-05 DIAGNOSIS — R339 Retention of urine, unspecified: Secondary | ICD-10-CM | POA: Diagnosis present

## 2021-12-05 DIAGNOSIS — E039 Hypothyroidism, unspecified: Secondary | ICD-10-CM | POA: Diagnosis present

## 2021-12-05 DIAGNOSIS — E876 Hypokalemia: Secondary | ICD-10-CM | POA: Diagnosis present

## 2021-12-05 DIAGNOSIS — Z20822 Contact with and (suspected) exposure to covid-19: Secondary | ICD-10-CM | POA: Diagnosis present

## 2021-12-05 DIAGNOSIS — S32001A Stable burst fracture of unspecified lumbar vertebra, initial encounter for closed fracture: Secondary | ICD-10-CM | POA: Diagnosis present

## 2021-12-05 DIAGNOSIS — I10 Essential (primary) hypertension: Secondary | ICD-10-CM | POA: Diagnosis present

## 2021-12-05 DIAGNOSIS — Z79890 Hormone replacement therapy (postmenopausal): Secondary | ICD-10-CM | POA: Diagnosis not present

## 2021-12-05 DIAGNOSIS — S22089A Unspecified fracture of T11-T12 vertebra, initial encounter for closed fracture: Secondary | ICD-10-CM | POA: Diagnosis present

## 2021-12-05 LAB — BASIC METABOLIC PANEL
Anion gap: 10 (ref 5–15)
BUN: 7 mg/dL (ref 6–20)
CO2: 24 mmol/L (ref 22–32)
Calcium: 9 mg/dL (ref 8.9–10.3)
Chloride: 101 mmol/L (ref 98–111)
Creatinine, Ser: 0.94 mg/dL (ref 0.44–1.00)
GFR, Estimated: >60 mL/min (ref >60)
Glucose, Bld: 133 mg/dL — ABNORMAL HIGH (ref 70–99)
Potassium: 4 mmol/L (ref 3.5–5.1)
Sodium: 135 mmol/L (ref 135–145)

## 2021-12-05 LAB — CBC
HCT: 38.9 % (ref 36.0–46.0)
Hemoglobin: 13.6 g/dL (ref 12.0–15.0)
MCH: 30.6 pg (ref 26.0–34.0)
MCHC: 35 g/dL (ref 30.0–36.0)
MCV: 87.4 fL (ref 80.0–100.0)
Platelets: 216 10*3/uL (ref 150–400)
RBC: 4.45 MIL/uL (ref 3.87–5.11)
RDW: 12.6 % (ref 11.5–15.5)
WBC: 15.9 10*3/uL — ABNORMAL HIGH (ref 4.0–10.5)
nRBC: 0 % (ref 0.0–0.2)

## 2021-12-05 LAB — HIV ANTIBODY (ROUTINE TESTING W REFLEX): HIV Screen 4th Generation wRfx: NONREACTIVE

## 2021-12-05 LAB — RESP PANEL BY RT-PCR (FLU A&B, COVID) ARPGX2
Influenza A by PCR: NEGATIVE
Influenza B by PCR: NEGATIVE
SARS Coronavirus 2 by RT PCR: NEGATIVE

## 2021-12-05 MED ORDER — ONDANSETRON HCL 4 MG/2ML IJ SOLN: 4.0000 mg | Freq: Four times a day (QID) | INTRAMUSCULAR | Status: DC | PRN

## 2021-12-05 MED ORDER — SODIUM CHLORIDE 0.9 % IV SOLN: INTRAVENOUS | Status: DC

## 2021-12-05 MED ORDER — ACETAMINOPHEN 500 MG PO TABS
1000.0000 mg | ORAL_TABLET | Freq: Four times a day (QID) | ORAL | Status: DC
Start: 2021-12-05 — End: 2021-12-06
  Administered 2021-12-05 – 2021-12-06 (×6): 1000 mg via ORAL
  Filled 2021-12-05 (×6): qty 2

## 2021-12-05 MED ORDER — ONDANSETRON 4 MG PO TBDP: 4.0000 mg | ORAL_TABLET | Freq: Four times a day (QID) | ORAL | Status: DC | PRN

## 2021-12-05 MED ORDER — OXYCODONE HCL 5 MG PO TABS
10.0000 mg | ORAL_TABLET | ORAL | Status: DC | PRN
  Administered 2021-12-05 – 2021-12-06 (×6): 10 mg via ORAL
  Filled 2021-12-05 (×7): qty 2

## 2021-12-05 MED ORDER — HYDROMORPHONE HCL 1 MG/ML IJ SOLN
1.0000 mg | INTRAMUSCULAR | Status: DC | PRN
  Administered 2021-12-05 (×3): 1 mg via INTRAVENOUS
  Filled 2021-12-05 (×3): qty 1

## 2021-12-05 MED ORDER — DOCUSATE SODIUM 100 MG PO CAPS
100.0000 mg | ORAL_CAPSULE | Freq: Two times a day (BID) | ORAL | Status: DC
  Administered 2021-12-05 – 2021-12-06 (×3): 100 mg via ORAL
  Filled 2021-12-05 (×3): qty 1

## 2021-12-05 MED ORDER — HYDROMORPHONE HCL 1 MG/ML IJ SOLN
1.0000 mg | Freq: Once | INTRAMUSCULAR | Status: AC
  Administered 2021-12-05: 1 mg via INTRAVENOUS
  Filled 2021-12-05: qty 1

## 2021-12-05 MED ORDER — HYDRALAZINE HCL 20 MG/ML IJ SOLN
10.0000 mg | INTRAMUSCULAR | Status: DC | PRN
  Filled 2021-12-05: qty 1

## 2021-12-05 MED ORDER — METOPROLOL TARTRATE 5 MG/5ML IV SOLN
5.0000 mg | Freq: Four times a day (QID) | INTRAVENOUS | Status: DC | PRN
  Filled 2021-12-05: qty 5

## 2021-12-05 MED ORDER — METHOCARBAMOL 1000 MG/10ML IJ SOLN
500.0000 mg | Freq: Four times a day (QID) | INTRAVENOUS | Status: DC | PRN
  Filled 2021-12-05 (×3): qty 5

## 2021-12-05 MED ORDER — TRAMADOL HCL 50 MG PO TABS
50.0000 mg | ORAL_TABLET | Freq: Four times a day (QID) | ORAL | Status: DC | PRN
  Administered 2021-12-05 (×3): 50 mg via ORAL
  Filled 2021-12-05 (×3): qty 1

## 2021-12-05 MED ORDER — BISACODYL 10 MG RE SUPP: 10.0000 mg | Freq: Every day | RECTAL | Status: DC | PRN

## 2021-12-05 MED ORDER — OXYCODONE HCL 5 MG PO TABS: 5.0000 mg | ORAL_TABLET | ORAL | Status: DC | PRN

## 2021-12-05 MED ORDER — GABAPENTIN 300 MG PO CAPS
300.0000 mg | ORAL_CAPSULE | Freq: Three times a day (TID) | ORAL | Status: DC
  Administered 2021-12-05 – 2021-12-09 (×13): 300 mg via ORAL
  Filled 2021-12-05 (×14): qty 1
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
## 2021-12-05 NOTE — TOC CAGE-AID Note (Signed)
Transition of Care (TOC) - CAGE-AID Screening ? ? ?Patient Details  ?Name: Destiny Bennett ?MRN: [id number] ?Date of Birth: 01/26/1961 ?Kaiyden Simkin, Collier Bullock, RN ?Phone Number: ?12/05/2021, 1:56 AM ? ? ?Clinical Narrative: ?Pt reports occasional etoh, denies tobacco, street drug usage. Declines need for resources.  ? ? ?CAGE-AID Screening: ?  ? ?Have You Ever Felt You Ought to Cut Down on Your Drinking or Drug Use?: No ?Have People Annoyed You By Critizing Your Drinking Or Drug Use?: No ?Have You Felt Bad Or Guilty About Your Drinking Or Drug Use?: No ?Have You Ever Had a Drink or Used Drugs First Thing In The Morning to Steady Your Nerves or to Get Rid of a Hangover?: No ?CAGE-AID Score: 0 ? ?Substance Abuse Education Offered: Yes ? ?  ? ? ? ? ? ? ?
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
## 2021-12-06 ENCOUNTER — Other Ambulatory Visit: Payer: Self-pay

## 2021-12-06 ENCOUNTER — Inpatient Hospital Stay (HOSPITAL_COMMUNITY): Payer: BC Managed Care – PPO | Admitting: Certified Registered Nurse Anesthetist

## 2021-12-06 ENCOUNTER — Encounter (HOSPITAL_COMMUNITY): Payer: Self-pay

## 2021-12-06 ENCOUNTER — Encounter (HOSPITAL_COMMUNITY)
Admission: EM | Disposition: A | Discharge status: Home / Self Care | Payer: Self-pay | Source: Home / Self Care | Attending: Neurosurgery

## 2021-12-06 ENCOUNTER — Inpatient Hospital Stay (HOSPITAL_COMMUNITY): Payer: BC Managed Care – PPO

## 2021-12-06 DIAGNOSIS — S32001A Stable burst fracture of unspecified lumbar vertebra, initial encounter for closed fracture: Secondary | ICD-10-CM | POA: Diagnosis present

## 2021-12-06 LAB — ABO/RH: ABO/RH(D): O POS

## 2021-12-06 LAB — TYPE AND SCREEN
ABO/RH(D): O POS
Antibody Screen: NEGATIVE

## 2021-12-06 LAB — SURGICAL PCR SCREEN
MRSA, PCR: NEGATIVE
Staphylococcus aureus: POSITIVE — AB

## 2021-12-06 SURGERY — LUMBAR PERCUTANEOUS PEDICLE SCREW 1 LEVEL
Anesthesia: General | Site: Back

## 2021-12-06 MED ORDER — ARTIFICIAL TEARS OPHTHALMIC OINT
TOPICAL_OINTMENT | OPHTHALMIC | Status: DC | PRN
  Administered 2021-12-06: 1 via OPHTHALMIC

## 2021-12-06 MED ORDER — SODIUM CHLORIDE 0.9% FLUSH: 3.0000 mL | INTRAVENOUS | Status: DC | PRN

## 2021-12-06 MED ORDER — LIDOCAINE-EPINEPHRINE 0.5 %-1:200000 IJ SOLN
INTRAMUSCULAR | Status: DC | PRN
  Administered 2021-12-06: 5 mL via INTRADERMAL

## 2021-12-06 MED ORDER — BUPIVACAINE HCL (PF) 0.5 % IJ SOLN
INTRAMUSCULAR | Status: AC
  Filled 2021-12-06: qty 30

## 2021-12-06 MED ORDER — THROMBIN 20000 UNITS EX SOLR
CUTANEOUS | Status: DC | PRN
  Administered 2021-12-06: 20 mL via TOPICAL

## 2021-12-06 MED ORDER — SODIUM CHLORIDE 0.9 % IV SOLN: 250.0000 mL | INTRAVENOUS | Status: DC

## 2021-12-06 MED ORDER — ACETAMINOPHEN 325 MG PO TABS: 650.0000 mg | ORAL_TABLET | ORAL | Status: DC | PRN

## 2021-12-06 MED ORDER — FENTANYL CITRATE (PF) 250 MCG/5ML IJ SOLN
INTRAMUSCULAR | Status: AC
  Filled 2021-12-06: qty 5

## 2021-12-06 MED ORDER — CEFAZOLIN SODIUM-DEXTROSE 2-4 GM/100ML-% IV SOLN
2.0000 g | Freq: Once | INTRAVENOUS | Status: AC
  Administered 2021-12-06: 2 g via INTRAVENOUS

## 2021-12-06 MED ORDER — LEVOTHYROXINE SODIUM 50 MCG PO TABS
50.0000 ug | ORAL_TABLET | Freq: Every day | ORAL | Status: DC
  Administered 2021-12-07 – 2021-12-09 (×3): 50 ug via ORAL
  Filled 2021-12-06 (×3): qty 1

## 2021-12-06 MED ORDER — LIDOCAINE-EPINEPHRINE 0.5 %-1:200000 IJ SOLN
INTRAMUSCULAR | Status: AC
  Filled 2021-12-06: qty 1

## 2021-12-06 MED ORDER — CEFAZOLIN SODIUM-DEXTROSE 2-4 GM/100ML-% IV SOLN
INTRAVENOUS | Status: AC
  Filled 2021-12-06: qty 100

## 2021-12-06 MED ORDER — THROMBIN 5000 UNITS EX SOLR
CUTANEOUS | Status: AC
  Filled 2021-12-06: qty 10000

## 2021-12-06 MED ORDER — BUPIVACAINE HCL (PF) 0.5 % IJ SOLN
INTRAMUSCULAR | Status: DC | PRN
  Administered 2021-12-06 (×2): 15 mL

## 2021-12-06 MED ORDER — MIDAZOLAM HCL 2 MG/2ML IJ SOLN
INTRAMUSCULAR | Status: AC
  Filled 2021-12-06: qty 2

## 2021-12-06 MED ORDER — SUGAMMADEX SODIUM 200 MG/2ML IV SOLN
INTRAVENOUS | Status: DC | PRN
  Administered 2021-12-06: 200 mg via INTRAVENOUS

## 2021-12-06 MED ORDER — PROPOFOL 10 MG/ML IV BOLUS
INTRAVENOUS | Status: DC | PRN
  Administered 2021-12-06: 150 mg via INTRAVENOUS

## 2021-12-06 MED ORDER — LIDOCAINE HCL (CARDIAC) PF 50 MG/5ML IV SOSY
PREFILLED_SYRINGE | INTRAVENOUS | Status: DC | PRN
  Administered 2021-12-06: 100 mg via INTRAVENOUS

## 2021-12-06 MED ORDER — ZOLPIDEM TARTRATE 5 MG PO TABS
5.0000 mg | ORAL_TABLET | Freq: Every evening | ORAL | Status: DC | PRN
  Administered 2021-12-07 – 2021-12-08 (×2): 5 mg via ORAL
  Filled 2021-12-06 (×2): qty 1

## 2021-12-06 MED ORDER — THROMBIN 20000 UNITS EX SOLR
CUTANEOUS | Status: AC
  Filled 2021-12-06: qty 20000

## 2021-12-06 MED ORDER — HYDRALAZINE HCL 20 MG/ML IJ SOLN
INTRAMUSCULAR | Status: AC
Start: 2021-12-06 — End: 2021-12-06
  Administered 2021-12-06: 5 mg via INTRAVENOUS
  Filled 2021-12-06: qty 1

## 2021-12-06 MED ORDER — GELATIN ABSORBABLE MT POWD
OROMUCOSAL | Status: DC | PRN
  Administered 2021-12-06 (×2): 5 mL via TOPICAL

## 2021-12-06 MED ORDER — HYDRALAZINE HCL 20 MG/ML IJ SOLN
5.0000 mg | Freq: Once | INTRAMUSCULAR | Status: AC
  Administered 2021-12-06: 5 mg via INTRAVENOUS

## 2021-12-06 MED ORDER — DEXAMETHASONE SODIUM PHOSPHATE 4 MG/ML IJ SOLN
INTRAMUSCULAR | Status: DC | PRN
  Administered 2021-12-06: 10 mg via INTRAVENOUS

## 2021-12-06 MED ORDER — ONDANSETRON HCL 4 MG/2ML IJ SOLN
INTRAMUSCULAR | Status: DC | PRN
  Administered 2021-12-06: 4 mg via INTRAVENOUS

## 2021-12-06 MED ORDER — MENTHOL 3 MG MT LOZG: 1.0000 | LOZENGE | OROMUCOSAL | Status: DC | PRN

## 2021-12-06 MED ORDER — LACTATED RINGERS IV SOLN: INTRAVENOUS | Status: DC

## 2021-12-06 MED ORDER — PHENYLEPHRINE HCL (PRESSORS) 10 MG/ML IV SOLN
INTRAVENOUS | Status: DC | PRN
  Administered 2021-12-06: 120 ug via INTRAVENOUS

## 2021-12-06 MED ORDER — MIDAZOLAM HCL 5 MG/5ML IJ SOLN
INTRAMUSCULAR | Status: DC | PRN
  Administered 2021-12-06: 2 mg via INTRAVENOUS

## 2021-12-06 MED ORDER — OXYCODONE HCL ER 15 MG PO T12A
15.0000 mg | EXTENDED_RELEASE_TABLET | Freq: Two times a day (BID) | ORAL | Status: DC
  Administered 2021-12-06 – 2021-12-09 (×6): 15 mg via ORAL
  Filled 2021-12-06 (×6): qty 1

## 2021-12-06 MED ORDER — FENTANYL CITRATE (PF) 100 MCG/2ML IJ SOLN
INTRAMUSCULAR | Status: DC | PRN
  Administered 2021-12-06: 100 ug via INTRAVENOUS
  Administered 2021-12-06 (×3): 50 ug via INTRAVENOUS

## 2021-12-06 MED ORDER — HYDRALAZINE HCL 20 MG/ML IJ SOLN: 5.0000 mg | Freq: Once | INTRAMUSCULAR | Status: AC

## 2021-12-06 MED ORDER — 0.9 % SODIUM CHLORIDE (POUR BTL) OPTIME
TOPICAL | Status: DC | PRN
  Administered 2021-12-06: 1000 mL

## 2021-12-06 MED ORDER — CHLORHEXIDINE GLUCONATE 0.12 % MT SOLN: 15.0000 mL | Freq: Once | OROMUCOSAL | Status: AC

## 2021-12-06 MED ORDER — PHENOL 1.4 % MT LIQD: 1.0000 | OROMUCOSAL | Status: DC | PRN

## 2021-12-06 MED ORDER — DIAZEPAM 5 MG PO TABS
5.0000 mg | ORAL_TABLET | Freq: Four times a day (QID) | ORAL | Status: DC | PRN
  Administered 2021-12-08 – 2021-12-09 (×5): 5 mg via ORAL
  Filled 2021-12-06 (×5): qty 1

## 2021-12-06 MED ORDER — CITALOPRAM HYDROBROMIDE 40 MG PO TABS
40.0000 mg | ORAL_TABLET | Freq: Every day | ORAL | Status: DC
  Administered 2021-12-07 – 2021-12-09 (×3): 40 mg via ORAL
  Filled 2021-12-06 (×3): qty 1

## 2021-12-06 MED ORDER — HYDROCHLOROTHIAZIDE 12.5 MG PO TABS
12.5000 mg | ORAL_TABLET | Freq: Every day | ORAL | Status: DC
  Administered 2021-12-07 – 2021-12-09 (×3): 12.5 mg via ORAL
  Filled 2021-12-06 (×3): qty 1

## 2021-12-06 MED ORDER — FENTANYL CITRATE (PF) 100 MCG/2ML IJ SOLN: 25.0000 ug | INTRAMUSCULAR | Status: DC | PRN

## 2021-12-06 MED ORDER — CHLORHEXIDINE GLUCONATE 0.12 % MT SOLN
OROMUCOSAL | Status: AC
Start: 2021-12-06 — End: 2021-12-06
  Administered 2021-12-06: 15 mL via OROMUCOSAL
  Filled 2021-12-06: qty 15

## 2021-12-06 MED ORDER — CHLORHEXIDINE GLUCONATE CLOTH 2 % EX PADS
6.0000 | MEDICATED_PAD | Freq: Every day | CUTANEOUS | Status: DC
  Administered 2021-12-07 – 2021-12-09 (×3): 6 via TOPICAL

## 2021-12-06 MED ORDER — SUMATRIPTAN SUCCINATE 50 MG PO TABS
50.0000 mg | ORAL_TABLET | ORAL | Status: DC | PRN
  Administered 2021-12-07: 50 mg via ORAL
  Filled 2021-12-06 (×2): qty 1

## 2021-12-06 MED ORDER — OXYCODONE HCL 5 MG PO TABS
5.0000 mg | ORAL_TABLET | ORAL | Status: DC | PRN
  Administered 2021-12-07 (×2): 5 mg via ORAL
  Filled 2021-12-06: qty 1

## 2021-12-06 MED ORDER — ROCURONIUM BROMIDE 100 MG/10ML IV SOLN
INTRAVENOUS | Status: DC | PRN
  Administered 2021-12-06 (×2): 10 mg via INTRAVENOUS
  Administered 2021-12-06: 50 mg via INTRAVENOUS
  Administered 2021-12-06: 5 mg via INTRAVENOUS

## 2021-12-06 MED ORDER — THROMBIN 5000 UNITS EX SOLR
CUTANEOUS | Status: AC
  Filled 2021-12-06: qty 5000

## 2021-12-06 MED ORDER — ACETAMINOPHEN 500 MG PO TABS
1000.0000 mg | ORAL_TABLET | Freq: Four times a day (QID) | ORAL | Status: AC
  Administered 2021-12-06 – 2021-12-07 (×4): 1000 mg via ORAL
  Filled 2021-12-06 (×4): qty 2

## 2021-12-06 MED ORDER — POTASSIUM CHLORIDE IN NACL 20-0.9 MEQ/L-% IV SOLN
INTRAVENOUS | Status: DC
  Filled 2021-12-06 (×2): qty 1000

## 2021-12-06 MED ORDER — ORAL CARE MOUTH RINSE: 15.0000 mL | Freq: Once | OROMUCOSAL | Status: AC

## 2021-12-06 MED ORDER — SODIUM CHLORIDE 0.9% FLUSH
3.0000 mL | Freq: Two times a day (BID) | INTRAVENOUS | Status: DC
  Administered 2021-12-07 – 2021-12-08 (×2): 3 mL via INTRAVENOUS

## 2021-12-06 MED ORDER — PHENYLEPHRINE HCL-NACL 20-0.9 MG/250ML-% IV SOLN
INTRAVENOUS | Status: DC | PRN
  Administered 2021-12-06: 40 ug/min via INTRAVENOUS

## 2021-12-06 MED ORDER — ACETAMINOPHEN 650 MG RE SUPP: 650.0000 mg | RECTAL | Status: DC | PRN

## 2021-12-06 MED ORDER — SUCCINYLCHOLINE CHLORIDE 200 MG/10ML IV SOSY
PREFILLED_SYRINGE | INTRAVENOUS | Status: DC | PRN
  Administered 2021-12-06: 110 mg via INTRAVENOUS

## 2021-12-06 SURGICAL SUPPLY — 74 items
BAG COUNTER SPONGE SURGICOUNT (BAG) ×3 IMPLANT
BASKET BONE COLLECTION (BASKET) ×1 IMPLANT
BENZOIN TINCTURE PRP APPL 2/3 (GAUZE/BANDAGES/DRESSINGS) IMPLANT
BLADE CLIPPER SURG (BLADE) IMPLANT
BLADE SURG 10 STRL SS (BLADE) ×1 IMPLANT
BUR MATCHSTICK NEURO 3.0 LAGG (BURR) ×2 IMPLANT
BUR PRECISION FLUTE 5.0 (BURR) ×2 IMPLANT
CANISTER SUCT 3000ML PPV (MISCELLANEOUS) ×2 IMPLANT
CARTRIDGE OIL MAESTRO DRILL (MISCELLANEOUS) ×1 IMPLANT
CNTNR URN SCR LID CUP LEK RST (MISCELLANEOUS) ×1 IMPLANT
CONT SPEC 4OZ STRL OR WHT (MISCELLANEOUS) ×1
COVER BACK TABLE 60X90IN (DRAPES) ×2 IMPLANT
DECANTER SPIKE VIAL GLASS SM (MISCELLANEOUS) ×2 IMPLANT
DERMABOND ADVANCED (GAUZE/BANDAGES/DRESSINGS) ×2
DERMABOND ADVANCED .7 DNX12 (GAUZE/BANDAGES/DRESSINGS) ×1 IMPLANT
DIFFUSER DRILL AIR PNEUMATIC (MISCELLANEOUS) ×2 IMPLANT
DRAPE C-ARM 42X72 X-RAY (DRAPES) ×4 IMPLANT
DRAPE C-ARMOR (DRAPES) ×1 IMPLANT
DRAPE LAPAROTOMY 100X72X124 (DRAPES) ×2 IMPLANT
DRAPE SURG 17X23 STRL (DRAPES) ×2 IMPLANT
DRSG OPSITE POSTOP 4X10 (GAUZE/BANDAGES/DRESSINGS) ×1 IMPLANT
DURAPREP 26ML APPLICATOR (WOUND CARE) ×2 IMPLANT
ELECT REM PT RETURN 9FT ADLT (ELECTROSURGICAL) ×2
ELECTRODE REM PT RTRN 9FT ADLT (ELECTROSURGICAL) ×1 IMPLANT
GAUZE 4X4 16PLY ~~LOC~~+RFID DBL (SPONGE) ×3 IMPLANT
GAUZE SPONGE 4X4 12PLY STRL (GAUZE/BANDAGES/DRESSINGS) IMPLANT
GLOVE EXAM NITRILE XL STR (GLOVE) IMPLANT
GLOVE SURG LTX SZ6.5 (GLOVE) ×4 IMPLANT
GLOVE SURG LTX SZ7 (GLOVE) ×4 IMPLANT
GLOVE SURG POLYISO LF SZ6.5 (GLOVE) ×1 IMPLANT
GLOVE SURG UNDER POLY LF SZ7 (GLOVE) ×1 IMPLANT
GLOVE SURG UNDER POLY LF SZ7.5 (GLOVE) ×2 IMPLANT
GOWN STRL REUS W/ TWL LRG LVL3 (GOWN DISPOSABLE) ×2 IMPLANT
GOWN STRL REUS W/ TWL XL LVL3 (GOWN DISPOSABLE) IMPLANT
GOWN STRL REUS W/TWL 2XL LVL3 (GOWN DISPOSABLE) IMPLANT
GOWN STRL REUS W/TWL LRG LVL3 (GOWN DISPOSABLE) ×4
GOWN STRL REUS W/TWL XL LVL3 (GOWN DISPOSABLE)
GRAFT BN 10X1XDBM MAGNIFUSE (Bone Implant) IMPLANT
GRAFT BONE MAGNIFUSE 1X10CM (Bone Implant) ×1 IMPLANT
GUIDEWIRE EVEREST 1.4X620 2-PK (WIRE) ×4 IMPLANT
KIT BASIN OR (CUSTOM PROCEDURE TRAY) ×2 IMPLANT
KIT POSITION SURG JACKSON T1 (MISCELLANEOUS) ×2 IMPLANT
KIT TURNOVER KIT B (KITS) ×2 IMPLANT
MILL MEDIUM DISP (BLADE) ×1 IMPLANT
NDL BIOPSY DD SERENGETI 8G (NEEDLE) IMPLANT
NDL HYPO 25X1 1.5 SAFETY (NEEDLE) ×1 IMPLANT
NDL RETRACTOR BT 8G (NEEDLE) IMPLANT
NDL SPNL 18GX3.5 QUINCKE PK (NEEDLE) IMPLANT
NEEDLE BIOPSY DD SERENGETI 8G (NEEDLE) ×6 IMPLANT
NEEDLE HYPO 25X1 1.5 SAFETY (NEEDLE) ×2 IMPLANT
NEEDLE RETRACTOR BT 8G (NEEDLE) ×2 IMPLANT
NEEDLE SPNL 18GX3.5 QUINCKE PK (NEEDLE) IMPLANT
NS IRRIG 1000ML POUR BTL (IV SOLUTION) ×2 IMPLANT
OIL CARTRIDGE MAESTRO DRILL (MISCELLANEOUS) ×2
PACK LAMINECTOMY NEURO (CUSTOM PROCEDURE TRAY) ×2 IMPLANT
PAD ARMBOARD 7.5X6 YLW CONV (MISCELLANEOUS) ×4 IMPLANT
ROD STRT DENALI 5.5X150 (Rod) ×2 IMPLANT
SCREW CANN PA EVEREST 5.5X40 (Screw) ×5 IMPLANT
SCREW PA FENS EVEREST 6.5X40 (Screw) ×3 IMPLANT
SET SCREW (Screw) ×8 IMPLANT
SET SCREW VRST (Screw) IMPLANT
SPONGE SURGIFOAM ABS GEL 100 (HEMOSTASIS) ×2 IMPLANT
SPONGE T-LAP 4X18 ~~LOC~~+RFID (SPONGE) ×1 IMPLANT
STRIP CLOSURE SKIN 1/2X4 (GAUZE/BANDAGES/DRESSINGS) IMPLANT
SUT PROLENE 6 0 BV (SUTURE) IMPLANT
SUT VIC AB 0 CT1 18XCR BRD8 (SUTURE) ×1 IMPLANT
SUT VIC AB 0 CT1 8-18 (SUTURE) ×2
SUT VIC AB 2-0 CT1 18 (SUTURE) ×4 IMPLANT
SUT VIC AB 3-0 SH 8-18 (SUTURE) ×6 IMPLANT
TOWEL GREEN STERILE (TOWEL DISPOSABLE) ×2 IMPLANT
TOWEL GREEN STERILE FF (TOWEL DISPOSABLE) ×2 IMPLANT
TRAY FOLEY MTR SLVR 16FR STAT (SET/KITS/TRAYS/PACK) ×1 IMPLANT
TRAY FOLEY W/BAG SLVR 14FR (SET/KITS/TRAYS/PACK) ×1 IMPLANT
WATER STERILE IRR 1000ML POUR (IV SOLUTION) ×2 IMPLANT
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
## 2021-12-06 NOTE — Anesthesia Procedure Notes (Signed)
Procedure Name: Intubation ?Date/Time: 12/06/2021 4:15 PM ?Performed by: Edmonia Caprio, CRNA ?Pre-anesthesia Checklist: Patient identified, Emergency Drugs available, Suction available and Patient being monitored ?Patient Re-evaluated:Patient Re-evaluated prior to induction ?Oxygen Delivery Method: Circle system utilized ?Preoxygenation: Pre-oxygenation with 100% oxygen ?Induction Type: IV induction and Rapid sequence ?Laryngoscope Size: Hyacinth Meeker and 2 ?Grade View: Grade I ?Tube type: Oral ?Tube size: 7.0 mm ?Number of attempts: 1 ?Airway Equipment and Method: Stylet ?Placement Confirmation: ETT inserted through vocal cords under direct vision, positive ETCO2 and breath sounds checked- equal and bilateral ?Secured at: 22 cm ?Tube secured with: Tape ?Dental Injury: Teeth and Oropharynx as per pre-operative assessment  ? ? ? ? ?

## 2021-12-06 NOTE — Op Note (Signed)
12/06/2021 ? ?9:04 PM ? ?PATIENT:  Destiny Bennett  61 y.o. female ? ?PRE-OPERATIVE DIAGNOSIS:  Lumbar one Burst Fracture ? ?POST-OPERATIVE DIAGNOSIS:  Lumbar one Burst Fracture ? ?PROCEDURE:  Procedure(s): ?Open Reduction Internal Fixation  Thoracic Eleven - Lumbar Three, Thoracic Twelve - Lumbar Two Posterior Lateral Arthrodesis, LAMINECTOMY Lumbar one for decompression  ?Segmental pedicle screw fixation T11-L3(stryker, percutaneous) ? ?SURGEON: Surgeon(s): ?Ashok Pall, MD ? ?ASSISTANTS:none ? ?ANESTHESIA:   local and general ? ?EBL:  Total I/O ?In: 1000 [I.V.:1000] ?Out: 220 [Urine:70; Blood:150] ? ?BLOOD ADMINISTERED:none ? ?CELL SAVER GIVEN:none ? ?COUNT:per nursing ? ?DRAINS: none  ? ?SPECIMEN:  No Specimen ? ?DICTATION: [name] was taken to the operating room, intubated, and placed under a general anesthetic without difficulty. A foley catheter was placed under sterile conditions She was positioned prone on a Jackson table with all pressure points padded. Her lumbar thoracic region was prepped and draped in a sterile manner. With fluoroscopic guidance pedicle screws were placed in the T11,12,L2, and L3 pedicles in the following fashion. With AP orientation I placed a Jamshidi needle into the vertebral body through the pedicle. The needle path was evaluated both in the AP and lateral planes. Once verified and deemed satisfactory I placed Kwires into the vertebral body through the needle. I placed the screw over the Kwire and screwed it into the vertebral body, checking placement with fluoroscopy. When all screws were placed , I placed a rod through the tulip  heads then secured the rods with locking caps. The construct was assessed with fluoroscopy  and it showed all the hardware to be in good position.  ?I then made a midline incision to expose the L1 lamina. I performed a complete laminectomy of L1 to effect a decompression of the thecal sac and L1, and L2 roots. I used a tamp to push some of the bone  out of the canal anteriorly.  ?I decorticated the lamina of L2 and T11 bilaterally and placed allograft morsels which spanned the L1 disc space.  ?I closed all the wounds in layers approximating the deep fascia, subcutaneous and subcuticular planes with vicryl sutures.  ?I placed a sterile dressing on the closures along with Dermabond. She was rolled onto the OR stretcher, then extubated. She was taken to the [facility] for recovery. ? ?PLAN OF CARE: Admit to inpatient  ? ?PATIENT DISPOSITION:  PACU - hemodynamically stable. ?  ?Delay start of Pharmacological VTE agent (>24hrs) due to surgical blood loss or risk of bleeding:  no ? ?  ?

## 2021-12-06 NOTE — Progress Notes (Signed)
Patient arrived to room 4N14 from PACU.  Assessment complete, VS obtained, ginger ale and graham crackers provided, husband at bedside.  ?

## 2021-12-06 NOTE — Anesthesia Postprocedure Evaluation (Signed)
Anesthesia Post Note ? ?Patient: Destiny Bennett ? ?Procedure(s) Performed: Open Reduction Internal Fixation  Thoracic Eleven - Lumbar Three, Thoracic Twelve - Lumbar Two Posterior Lateral Arthrodesis, LAMINECTOMY Lumbar one (Back) ? ?  ? ?Patient location during evaluation: PACU ?Anesthesia Type: General ?Level of consciousness: awake ?Pain management: pain level controlled ?Respiratory status: spontaneous breathing ?Cardiovascular status: stable ?Postop Assessment: no apparent nausea or vomiting ?Anesthetic complications: no ? ? ?No notable events documented. ? ?Last Vitals:  ?Vitals:  ? 12/06/21 2155 12/06/21 2158  ?BP: (!) 166/91 (!) 158/75  ?Pulse: 85 81  ?Resp:  20  ?Temp: 36.6 ?C 37.1 ?C  ?SpO2: 90% 99%  ?  ?Last Pain:  ?Vitals:  ? 12/06/21 2158  ?TempSrc: Oral  ?PainSc:   ? ? ?  ?  ?  ?  ?  ?  ? ?[name] ? ? ? ? ?

## 2021-12-06 NOTE — Transfer of Care (Signed)
Immediate Anesthesia Transfer of Care Note ? ?Patient: Destiny Bennett ? ?Procedure(s) Performed: Open Reduction Internal Fixation  Thoracic Eleven - Lumbar Three, Thoracic Twelve - Lumbar Two Posterior Lateral Arthrodesis, LAMINECTOMY Lumbar one (Back) ? ?Patient Location: PACU ? ?Anesthesia Type:General ? ?Level of Consciousness: awake, alert  and oriented ? ?Airway & Oxygen Therapy: Patient Spontanous Breathing ? ?Post-op Assessment: Report given to RN, Post -op Vital signs reviewed and stable and Patient moving all extremities X 4 ? ?Post vital signs: Reviewed and stable ? ?Last Vitals:  ?Vitals Value Taken Time  ?BP 146/88 12/06/21 2042  ?Temp    ?Pulse 103 12/06/21 2046  ?Resp 17 12/06/21 2046  ?SpO2 96 % 12/06/21 2046  ?Vitals shown include unvalidated device data. ? ?Last Pain:  ?Vitals:  ? 12/06/21 1444  ?TempSrc: Oral  ?PainSc:   ?   ? ?  ? ?Complications: No notable events documented. ?

## 2021-12-06 NOTE — Anesthesia Preprocedure Evaluation (Addendum)
Anesthesia Evaluation  ?Patient identified by MRN, date of birth, ID band ?Patient awake ? ? ? ?Reviewed: ?Allergy & Precautions, NPO status , Patient's Chart, lab work & pertinent test results ? ?Airway ?Mallampati: II ? ?TM Distance: >3 FB ? ? ? ? Dental ?  ?Pulmonary ? ?  ?breath sounds clear to auscultation ? ? ? ? ? ? Cardiovascular ?hypertension,  ?Rhythm:Regular Rate:Normal ? ? ?  ?Neuro/Psych ? Headaches, PSYCHIATRIC DISORDERS   ? GI/Hepatic ?negative GI ROS, Neg liver ROS,   ?Endo/Other  ?Hypothyroidism  ? Renal/GU ?negative Renal ROS  ? ?  ?Musculoskeletal ? ? Abdominal ?  ?Peds ? Hematology ?  ?Anesthesia Other Findings ? ? Reproductive/Obstetrics ? ?  ? ? ? ? ? ? ? ? ? ? ? ? ? ?  ?  ? ? ? ? ? ? ? ?Anesthesia Physical ?Anesthesia Plan ? ?ASA: 3 ? ?Anesthesia Plan: General  ? ?Post-op Pain Management:   ? ?Induction: Intravenous ? ?PONV Risk Score and Plan: Treatment may vary due to age or medical condition ? ?Airway Management Planned: Oral ETT ? ?Additional Equipment:  ? ?Intra-op Plan:  ? ?Post-operative Plan: Possible Post-op intubation/ventilation ? ?Informed Consent: I have reviewed the patients History and Physical, chart, labs and discussed the procedure including the risks, benefits and alternatives for the proposed anesthesia with the patient or authorized representative who has indicated his/her understanding and acceptance.  ? ? ? ?Dental advisory given ? ?Plan Discussed with: Anesthesiologist and CRNA ? ?Anesthesia Plan Comments:   ? ? ? ? ? ?Anesthesia Quick Evaluation ? ?

## 2021-12-06 NOTE — Progress Notes (Signed)
Patient ID: Destiny Bennett, female   DOB: 11/05/1960, 61 y.o.   MRN: [id number] ?BP (!) 177/85 (BP Location: Left Arm)   Pulse 67   Temp 99 ?F (37.2 ?C) (Oral)   Resp 13   Ht 5\' 6"  (1.676 m)   Wt 76.3 kg   SpO2 97%   BMI 27.15 kg/m?  ?Alert and oriented x 4, speech is clear and fluent ?Moving lower extremities well ?Pain is improved ?Plan on OR later this afternoon. ?

## 2021-12-07 MED ORDER — MUPIROCIN 2 % EX OINT
1.0000 "application " | TOPICAL_OINTMENT | Freq: Two times a day (BID) | CUTANEOUS | Status: DC
  Administered 2021-12-07 – 2021-12-09 (×5): 1 via NASAL
  Filled 2021-12-07 (×2): qty 22

## 2021-12-07 MED ORDER — CHLORHEXIDINE GLUCONATE CLOTH 2 % EX PADS
6.0000 | MEDICATED_PAD | Freq: Every day | CUTANEOUS | Status: DC
  Administered 2021-12-07 – 2021-12-09 (×2): 6 via TOPICAL
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
## 2021-12-07 NOTE — Progress Notes (Signed)
Orthopedic Tech Progress Note ?Patient Details:  ?Destiny Bennett ?May 19, 1961 ?[id number] ? ?Ortho Devices ?Type of Ortho Device: Lumbar corsett ?Ortho Device/Splint Location: delivered to room. ?Ortho Device/Splint Interventions: Ordered ?  ?  ? ?Karolee Stamps ?12/07/2021, 6:07 AM ? ?

## 2021-12-07 NOTE — Progress Notes (Signed)
Patient ID: Destiny Bennett, female   DOB: [birth year], 61 y.o.   MRN: [id number] ?BP (!) 110/59 (BP Location: Left Arm)   Pulse 80   Temp 98.4 ?F (36.9 ?C) (Oral)   Resp 20   Ht 5' 5.98" (1.676 m)   Wt 76.3 kg   SpO2 96%   BMI 27.16 kg/m?  ?Alert and oriented x 4, moving lower extremities well ?Brace is on ?Wound is clean, dry, no signs of infection ?Was up today doing well. Discharge tomorrow ?

## 2021-12-07 NOTE — Progress Notes (Signed)
Mobility Specialist: Progress Note ? ? 12/07/21 1219  ?Mobility  ?Activity Ambulated independently in hallway  ?Level of Assistance Independent after set-up  ?Assistive Device None  ?Distance Ambulated (ft) 520 ft  ?Activity Response Tolerated well  ?$Mobility charge 1 Mobility  ? ?Received pt in chair having no complaints and agreeable to mobility. Asymptomatic throughout ambulation, returned back to chair w/ call bell in reach and all needs met. ? ?Christopher Day ?Mobility Specialist ?Mobility Specialist 5 [address]: [phone] ?Mobility Specialist 6 [address]: [phone] ? ?

## 2021-12-08 ENCOUNTER — Encounter (HOSPITAL_COMMUNITY): Payer: Self-pay | Admitting: Neurosurgery

## 2021-12-08 MED ORDER — TAMSULOSIN HCL 0.4 MG PO CAPS
0.4000 mg | ORAL_CAPSULE | Freq: Two times a day (BID) | ORAL | Status: DC
  Administered 2021-12-08 – 2021-12-09 (×3): 0.4 mg via ORAL
  Filled 2021-12-08 (×3): qty 1
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
## 2021-12-08 NOTE — Progress Notes (Signed)
Bladder scan revealed 568. Pt refused to be I/O cath at this time states she will ambulate again and attempt to void in 1 hour if unsuccessful she will allow I/O cath. Pt educated on importance of straight cath for continued urinary retention she verbalized understanding.  ?

## 2021-12-08 NOTE — Progress Notes (Signed)
Patient ID: Destiny Bennett, female   DOB: 1961-05-10, 61 y.o.   MRN: [id number] ?BP 120/65 (BP Location: Right Arm)   Pulse 93   Temp 99.4 ?F (37.4 ?C) (Oral)   Resp 20   Ht 5' 5.98" (1.676 m)   Wt 76.3 kg   SpO2 90%   BMI 27.16 kg/m?  ?Alert and oriented x 4  ?Speech is clear and fluent ?Moving lower extremities well ?Wound is clean, dry ?Unable to void today, will keep trying ?

## 2021-12-08 NOTE — Progress Notes (Signed)
Mobility Specialist Progress Note: ? ? 12/08/21 1514  ?Mobility  ?Activity Ambulated with assistance in hallway  ?Level of Assistance Contact guard assist, steadying assist  ?Assistive Device  ?(HHA)  ?Distance Ambulated (ft) 600 ft  ?Activity Response Tolerated well  ?$Mobility charge 1 Mobility  ? ?Pt received in bed willing to participate in mobility. No complaints of pain. Pt left in bed with call bell in reach and all needs met.  ? ?Ranette Luckadoo ?Mobility Specialist ?Primary Phone [phone] ? ?

## 2021-12-08 NOTE — Progress Notes (Signed)
Bladder scan revealed 874 ml.  ?I/O cath returned 1000 ml of clear yellow urine.  ? ?

## 2021-12-08 NOTE — Progress Notes (Signed)
1117: Dr. Franky Macho notified pt continues to have urinary retention. See new orders for flomax. Encouraged pt to increase fluid intake. Ambulated to bathroom independently attempted to urinate without success. ? ?1300: Bladder scan reveals 286 ml. Flomax administered. Pt states she is starting to have sensation of urine in bladder states she will attempt to urinate again in the next hour. Fluids encouraged.  ?

## 2021-12-09 MED ORDER — TIZANIDINE HCL 4 MG PO TABS: 4.0000 mg | ORAL_TABLET | Freq: Four times a day (QID) | ORAL | 0 refills | Status: DC | PRN

## 2021-12-09 MED ORDER — OXYCODONE HCL ER 15 MG PO T12A: 15.0000 mg | EXTENDED_RELEASE_TABLET | Freq: Two times a day (BID) | ORAL | 0 refills | Status: AC

## 2021-12-09 MED ORDER — OXYCODONE HCL 5 MG PO TABS: 5.0000 mg | ORAL_TABLET | Freq: Four times a day (QID) | ORAL | 0 refills | Status: AC | PRN

## 2021-12-09 NOTE — Progress Notes (Signed)
Per order foley removed at 1230. Pt due to void by 1830.  ?

## 2021-12-09 NOTE — Progress Notes (Signed)
Called Dr. Franky Macho in regards to foley and discharge. Ordered to remove foley and see if patient can void. MD to decide on discharge later today. Will continue to monitor pt closely. Jillyn Hidden, RN ?

## 2021-12-09 NOTE — Progress Notes (Signed)
Pt was discharged from facility at this time all written and verbal instruction given to patient. Pt was escorted via wheel chair with staff and husband to private vehicle. Left in satisfactory condition.Foley with leg bag intact. ?
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
## 2021-12-09 NOTE — Discharge Instructions (Signed)

## 2021-12-09 NOTE — Progress Notes (Signed)
Pt continues to be unable to void. Per Dr. Christella Noa foley placed returned 450 ml of clear yellow urine. Leg bag given to pt to wear upon discharge.  ?

## 2021-12-09 NOTE — Progress Notes (Signed)
Mobility Specialist: Progress Note ? ? 12/09/21 1101  ?Mobility  ?Activity Ambulated with assistance in hallway  ?Level of Assistance Contact guard assist, steadying assist  ?Assistive Device  ?(HHA)  ?Distance Ambulated (ft) 730 ft  ?Activity Response Tolerated well  ?$Mobility charge 1 Mobility  ? ?Pt received in chair and agreeable to ambulation. C/o 4/10 back pain during session when spasms occur, otherwise mostly described as soreness. Pt ambulated using HHA per request over RW. To bed after ambulation with call bell and phone at her side.  ? ?Cristal Deer Rylie Knierim ?Mobility Specialist ?Mobility Specialist 5 North: [phone] ?Mobility Specialist 6 North: [phone] ? ?
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
## 2021-12-10 ENCOUNTER — Telehealth: Payer: Self-pay

## 2021-12-10 NOTE — Telephone Encounter (Signed)
Transition Care Management Follow-up Telephone Call ?Date of discharge and from where: [facility] on 12/09/2021 ?Pt stated she is followed by her PCP in [place] , [facility] ?Verbalized having f/u appt with the surgeon and she will call her PCP and have a f/u appt as well.  ?

## 2021-12-11 DIAGNOSIS — S32001A Stable burst fracture of unspecified lumbar vertebra, initial encounter for closed fracture: Secondary | ICD-10-CM | POA: Diagnosis not present

## 2021-12-11 NOTE — Progress Notes (Signed)
Post discharge note: TOC notified patient had not received equipment ordered at discharge on 12/09/21.  Chart reviewed and [employer] rep Morrie Sheldon contact, per [place] equipment will be shipped to patient's home and expected to arrive in 1-2 days.  This CM spoke with patient and updated on the equipment arrival timeframe.   ?

## 2021-12-15 MED FILL — Hydromorphone HCl Inj 1 MG/ML: INTRAMUSCULAR | Qty: 1 | Status: AC

## 2021-12-16 ENCOUNTER — Other Ambulatory Visit: Payer: Self-pay

## 2021-12-16 ENCOUNTER — Ambulatory Visit: Payer: BC Managed Care – PPO | Admitting: Physician Assistant

## 2021-12-16 ENCOUNTER — Encounter: Payer: Self-pay | Admitting: Physician Assistant

## 2021-12-16 VITALS — BP 104/67 | HR 65 | Ht 66.0 in | Wt 155.0 lb

## 2021-12-16 DIAGNOSIS — K5903 Drug induced constipation: Secondary | ICD-10-CM

## 2021-12-16 DIAGNOSIS — S32001S Stable burst fracture of unspecified lumbar vertebra, sequela: Secondary | ICD-10-CM

## 2021-12-16 DIAGNOSIS — R339 Retention of urine, unspecified: Secondary | ICD-10-CM
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
## 2021-12-16 NOTE — Progress Notes (Signed)
Fill and Pull Catheter Removal ? ?Patient is present today for a catheter removal.  Patient was cleaned and prepped in a sterile fashion 438ml of sterile water/ saline was instilled into the bladder when the patient felt the urge to urinate. 47ml of water was then drained from the balloon.  A 14FR foley cath was removed from the bladder no complications were noted .  Patient as then given some time to void on their own.  Patient can void  432ml on their own after some time.  Patient tolerated well. ? ?Performed by: Levi Aland, CMA ? ?Follow up/ Additional notes: PA will assess after procedure.  ?

## 2021-12-16 NOTE — Patient Instructions (Signed)
Start over the counter medications as soon as possible: ? ?Senakot-S as directed on the label ?Miralax every night-dosing on the bottle ?

## 2021-12-30 ENCOUNTER — Encounter (HOSPITAL_COMMUNITY): Payer: Self-pay | Admitting: Radiology

## 2021-12-31 ENCOUNTER — Ambulatory Visit (INDEPENDENT_AMBULATORY_CARE_PROVIDER_SITE_OTHER): Payer: BC Managed Care – PPO | Admitting: Physician Assistant

## 2021-12-31 VITALS — BP 191/91 | HR 81 | Ht 66.0 in | Wt 155.0 lb

## 2021-12-31 DIAGNOSIS — R339 Retention of urine, unspecified: Secondary | ICD-10-CM | POA: Diagnosis not present

## 2021-12-31 DIAGNOSIS — N3001 Acute cystitis with hematuria: Secondary | ICD-10-CM | POA: Diagnosis not present

## 2021-12-31 LAB — URINALYSIS, ROUTINE W REFLEX MICROSCOPIC
Bilirubin, UA: NEGATIVE
Glucose, UA: NEGATIVE
Ketones, UA: NEGATIVE
Nitrite, UA: NEGATIVE
Specific Gravity, UA: 1.01 (ref 1.005–1.030)
Urobilinogen, Ur: 0.2 mg/dL (ref 0.2–1.0)
pH, UA: 5.5 (ref 5.0–7.5)

## 2021-12-31 LAB — MICROSCOPIC EXAMINATION
Renal Epithel, UA: NONE SEEN /hpf
WBC, UA: >30 /hpf — AB (ref 0–5)

## 2021-12-31 LAB — BLADDER SCAN AMB NON-IMAGING: Scan Result: 72

## 2021-12-31 MED ORDER — TAMSULOSIN HCL 0.4 MG PO CAPS: 0.4000 mg | ORAL_CAPSULE | Freq: Every day | ORAL | 11 refills | Status: DC

## 2021-12-31 MED ORDER — NITROFURANTOIN MONOHYD MACRO 100 MG PO CAPS: 100.0000 mg | ORAL_CAPSULE | Freq: Two times a day (BID) | ORAL | 0 refills | Status: AC

## 2021-12-31 NOTE — Addendum Note (Signed)
Addended by: Grier Rocher on: 12/31/2021 04:19 PM ? ? Modules accepted: Orders ? ?
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
## 2021-12-31 NOTE — Progress Notes (Signed)
post void residual=76mL ? ?

## 2022-01-03 LAB — URINE CULTURE

## 2022-01-05 ENCOUNTER — Telehealth: Payer: Self-pay
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
## 2022-02-10 ENCOUNTER — Ambulatory Visit (INDEPENDENT_AMBULATORY_CARE_PROVIDER_SITE_OTHER): Payer: BC Managed Care – PPO | Admitting: Physician Assistant

## 2022-02-10 VITALS — BP 156/83 | HR 64 | Ht 66.0 in | Wt 143.0 lb

## 2022-02-10 DIAGNOSIS — R339 Retention of urine, unspecified: Secondary | ICD-10-CM | POA: Diagnosis not present

## 2022-02-10 LAB — URINALYSIS, ROUTINE W REFLEX MICROSCOPIC
Bilirubin, UA: NEGATIVE
Glucose, UA: NEGATIVE
Ketones, UA: NEGATIVE
Leukocytes,UA: NEGATIVE
Nitrite, UA: NEGATIVE
Protein,UA: NEGATIVE
RBC, UA: NEGATIVE
Specific Gravity, UA: 1.015 (ref 1.005–1.030)
Urobilinogen, Ur: 0.2 mg/dL (ref 0.2–1.0)
pH, UA: 6.5 (ref 5.0–7.5)

## 2022-02-10 LAB — BLADDER SCAN AMB NON-IMAGING: Scan Result: 0
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
## 2022-02-10 NOTE — Progress Notes (Signed)
post void residual =0mL 

## 2022-02-17 DIAGNOSIS — I10 Essential (primary) hypertension: Secondary | ICD-10-CM | POA: Diagnosis not present

## 2022-02-17 DIAGNOSIS — E785 Hyperlipidemia, unspecified: Secondary | ICD-10-CM | POA: Diagnosis not present

## 2022-02-17 DIAGNOSIS — E039 Hypothyroidism, unspecified: Secondary | ICD-10-CM | POA: Diagnosis not present

## 2022-02-23 DIAGNOSIS — G4701 Insomnia due to medical condition: Secondary | ICD-10-CM | POA: Diagnosis not present

## 2022-02-23 DIAGNOSIS — E785 Hyperlipidemia, unspecified: Secondary | ICD-10-CM | POA: Diagnosis not present

## 2022-02-23 DIAGNOSIS — I10 Essential (primary) hypertension: Secondary | ICD-10-CM | POA: Diagnosis not present

## 2022-02-23 DIAGNOSIS — E039 Hypothyroidism, unspecified: Secondary | ICD-10-CM | POA: Diagnosis not present

## 2022-03-17 ENCOUNTER — Ambulatory Visit: Payer: BC Managed Care – PPO | Admitting: Urology

## 2023-10-31 ENCOUNTER — Encounter (INDEPENDENT_AMBULATORY_CARE_PROVIDER_SITE_OTHER): Payer: Self-pay | Admitting: *Deleted
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
## 2024-01-20 IMAGING — RF DG THORACOLUMBAR SPINE 2V
1 series · 2 of 2 positions shown · non-contrast
Comparison: CT chest abdomen pelvis 12/04/2021

Fluoroscopy time: 4 minutes 11 seconds

Dose: 99.79 mGy

Images: 2

CLINICAL DATA: ORIF T11-L3, T12-L2 posterior-lateral arthrodesis,
L1 laminectomy

EXAM:
THORACOLUMBAR SPINE 1V

[Series 1: run · 2 of 2 slices shown]
[im 1/2]
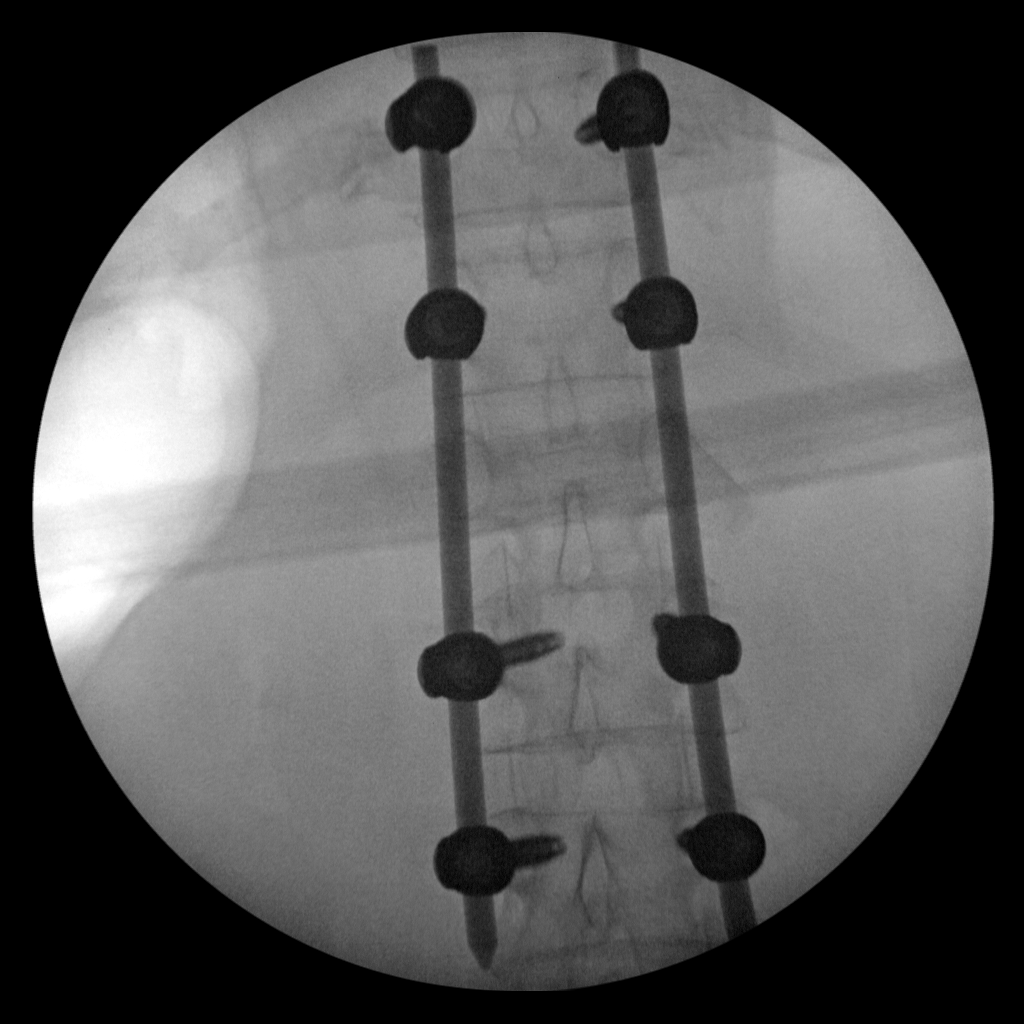
[im 2/2]
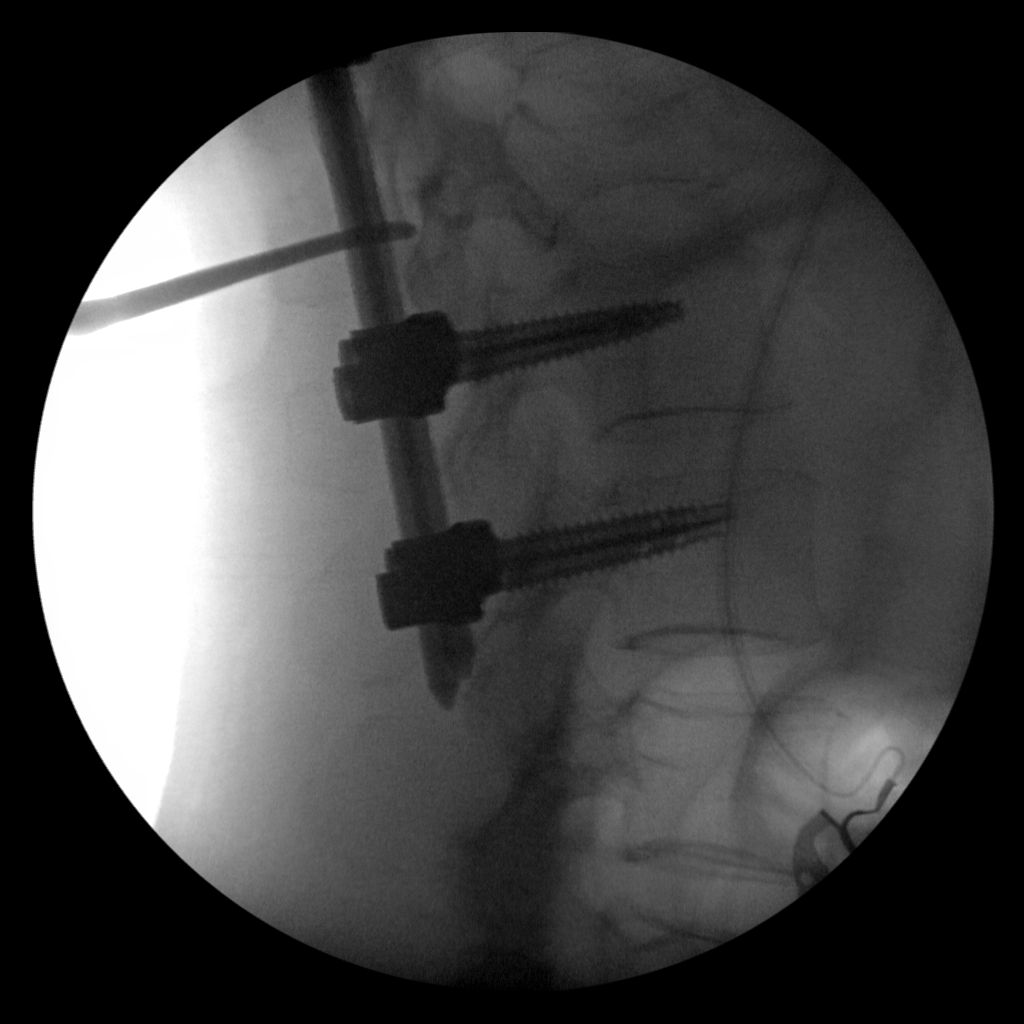

[2 of 2 positions shown; findings below may reference images not displayed]

FINDINGS: Two submitted images demonstrate posterior bars with pedicle screws
at T11, T12, L2, and L3.

These bridge the L1 level, which demonstrates a significant
compression fracture as noted on CT

On lateral view the pedicle screws and bars at T11 and T12 are not
visualized.

Bones appear demineralized.

No other osseous abnormality seen.
IMPRESSION: Posterior fusion T11-L3 bridging a compression fracture of L1.

## 2024-03-12 ENCOUNTER — Encounter (HOSPITAL_COMMUNITY): Payer: Self-pay

## 2024-03-12 ENCOUNTER — Observation Stay (HOSPITAL_COMMUNITY)
Admission: EM | Admit: 2024-03-12 | Discharge: 2024-03-13 | Disposition: A | Discharge status: Home / Self Care | Attending: Internal Medicine | Admitting: Internal Medicine

## 2024-03-12 ENCOUNTER — Other Ambulatory Visit: Payer: Self-pay

## 2024-03-12 ENCOUNTER — Emergency Department (HOSPITAL_COMMUNITY)

## 2024-03-12 DIAGNOSIS — S32010A Wedge compression fracture of first lumbar vertebra, initial encounter for closed fracture: Secondary | ICD-10-CM | POA: Diagnosis not present

## 2024-03-12 DIAGNOSIS — I10 Essential (primary) hypertension: Secondary | ICD-10-CM | POA: Diagnosis not present

## 2024-03-12 DIAGNOSIS — E039 Hypothyroidism, unspecified: Secondary | ICD-10-CM | POA: Insufficient documentation

## 2024-03-12 DIAGNOSIS — M5418 Radiculopathy, sacral and sacrococcygeal region: Secondary | ICD-10-CM | POA: Insufficient documentation

## 2024-03-12 DIAGNOSIS — Z48810 Encounter for surgical aftercare following surgery on the sense organs: Secondary | ICD-10-CM | POA: Diagnosis not present

## 2024-03-12 DIAGNOSIS — R81 Glycosuria: Secondary | ICD-10-CM | POA: Insufficient documentation

## 2024-03-12 DIAGNOSIS — W19XXXA Unspecified fall, initial encounter: Secondary | ICD-10-CM | POA: Diagnosis not present

## 2024-03-12 DIAGNOSIS — E871 Hypo-osmolality and hyponatremia: Secondary | ICD-10-CM | POA: Diagnosis not present

## 2024-03-12 DIAGNOSIS — Z114 Encounter for screening for human immunodeficiency virus [HIV]: Secondary | ICD-10-CM | POA: Diagnosis not present

## 2024-03-12 DIAGNOSIS — M533 Sacrococcygeal disorders, not elsewhere classified: Secondary | ICD-10-CM | POA: Diagnosis present

## 2024-03-12 DIAGNOSIS — S34101A Unspecified injury to L1 level of lumbar spinal cord, initial encounter: Secondary | ICD-10-CM | POA: Diagnosis present

## 2024-03-12 LAB — TSH: TSH: 1.135 u[IU]/mL (ref 0.350–4.500)

## 2024-03-12 LAB — URINALYSIS, ROUTINE W REFLEX MICROSCOPIC
Bilirubin Urine: NEGATIVE
Glucose, UA: 50 mg/dL — AB
Hgb urine dipstick: NEGATIVE
Ketones, ur: NEGATIVE mg/dL
Leukocytes,Ua: NEGATIVE
Nitrite: NEGATIVE
Protein, ur: NEGATIVE mg/dL
Specific Gravity, Urine: 1.004 — ABNORMAL LOW (ref 1.005–1.030)
pH: 6 (ref 5.0–8.0)

## 2024-03-12 LAB — CBC
HCT: 38.4 % (ref 36.0–46.0)
Hemoglobin: 14.2 g/dL (ref 12.0–15.0)
MCH: 32.8 pg (ref 26.0–34.0)
MCHC: 37 g/dL — ABNORMAL HIGH (ref 30.0–36.0)
MCV: 88.7 fL (ref 80.0–100.0)
Platelets: 179 10*3/uL (ref 150–400)
RBC: 4.33 MIL/uL (ref 3.87–5.11)
RDW: 12.4 % (ref 11.5–15.5)
WBC: 12.3 10*3/uL — ABNORMAL HIGH (ref 4.0–10.5)
nRBC: 0 % (ref 0.0–0.2)

## 2024-03-12 LAB — SODIUM, URINE, RANDOM: Sodium, Ur: 49 mmol/L

## 2024-03-12 LAB — BASIC METABOLIC PANEL WITH GFR
Anion gap: 12 (ref 5–15)
BUN: 11 mg/dL (ref 8–23)
CO2: 27 mmol/L (ref 22–32)
Calcium: 9.3 mg/dL (ref 8.9–10.3)
Chloride: 87 mmol/L — ABNORMAL LOW (ref 98–111)
Creatinine, Ser: 0.96 mg/dL (ref 0.44–1.00)
GFR, Estimated: >60 mL/min (ref >60)
Glucose, Bld: 113 mg/dL — ABNORMAL HIGH (ref 70–99)
Potassium: 3.3 mmol/L — ABNORMAL LOW (ref 3.5–5.1)
Sodium: 126 mmol/L — ABNORMAL LOW (ref 135–145)

## 2024-03-12 MED ORDER — ONDANSETRON 4 MG PO TBDP
4.0000 mg | ORAL_TABLET | Freq: Once | ORAL | Status: AC
  Administered 2024-03-12: 4 mg via ORAL
  Filled 2024-03-12: qty 1

## 2024-03-12 MED ORDER — DEXAMETHASONE SODIUM PHOSPHATE 10 MG/ML IJ SOLN
10.0000 mg | Freq: Once | INTRAMUSCULAR | Status: AC
  Administered 2024-03-12: 10 mg via INTRAVENOUS
  Filled 2024-03-12: qty 1

## 2024-03-12 MED ORDER — HYDROMORPHONE HCL 1 MG/ML IJ SOLN
1.0000 mg | Freq: Once | INTRAMUSCULAR | Status: AC
  Administered 2024-03-12: 1 mg via INTRAMUSCULAR
  Filled 2024-03-12: qty 1

## 2024-03-12 MED ORDER — LACTATED RINGERS IV SOLN: INTRAVENOUS | Status: DC

## 2024-03-12 MED ORDER — HYDROMORPHONE HCL 1 MG/ML IJ SOLN
1.0000 mg | Freq: Once | INTRAMUSCULAR | Status: AC
  Administered 2024-03-12: 1 mg via INTRAVENOUS
  Filled 2024-03-12: qty 1

## 2024-03-12 MED ORDER — OXYCODONE HCL 5 MG PO TABS
5.0000 mg | ORAL_TABLET | ORAL | Status: DC | PRN
  Administered 2024-03-13: 5 mg via ORAL
  Filled 2024-03-12: qty 1

## 2024-03-12 MED ORDER — SODIUM CHLORIDE 0.9 % IV BOLUS
1000.0000 mL | Freq: Once | INTRAVENOUS | Status: AC
  Administered 2024-03-12: 1000 mL via INTRAVENOUS

## 2024-03-12 MED ORDER — METHOCARBAMOL 500 MG PO TABS
750.0000 mg | ORAL_TABLET | Freq: Once | ORAL | Status: AC
  Administered 2024-03-12: 750 mg via ORAL
  Filled 2024-03-12: qty 2

## 2024-03-12 MED ORDER — HYDROMORPHONE HCL 1 MG/ML IJ SOLN
0.5000 mg | INTRAMUSCULAR | Status: DC | PRN
  Administered 2024-03-12 – 2024-03-13 (×2): 1 mg via INTRAVENOUS
  Filled 2024-03-12 (×2): qty 1
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
## 2024-03-12 NOTE — ED Notes (Signed)
 Pt laying in bed watching TV. Shows NAD at this time.

## 2024-03-12 NOTE — H&P (View-Only) (Signed)
 History and Physical:    Destiny Bennett   ZOX:[id number] DOB: 1960-10-07 DOA: 03/12/2024  Referring MD/provider: Katherine Pancake PCP: Caresse Chant, FNP   Patient coming from: Home  Chief Complaint: Fall, sacral pain  History of Present Illness:   Destiny Bennett is an 63 y.o. female with a PMH of hypertension, hypothyroidism, OCD and an L5 compression fracture status postsurgical repair who suffered from a mechanical fall when attempting to get into a truck yesterday.  The floorboard that she stepped onto gave way and she fell backwards landing on her tailbone.  Because of significant pain unrelieved by home medications, she presented to the ED for pain management and to rule out issues with her hardware.  She is currently rating the pain 5-6/10 and worse with any movement.  ED Course:  The patient's vital signs on presentation were: Blood pressure (!) 184/96, pulse 86, temperature 98.2 F (36.8 C), temperature source Oral, resp. rate 15, height 5' 6 (1.676 m), weight 64.9 kg, SpO2 93%.  With several doses of IV Dilaudid , Robaxin , and a 10 mg dose of Decadron , her pain came down to a level 3/10.  Because of persistent hypertension and some abnormal labs, the ED physician referred her for a further evaluation and workup.  Labs showed a new hyponatremia at 126, potassium 3.3, chloride 87, bicarb 27, glucose 113, BUN 11, creatinine 0.96.  Urinalysis was unremarkable other than 50 mg/dL of glucose in the urine.  Random urine sodium was 49.  Imaging showed an age-indeterminate wedge deformity of the L5 vertebral body with 10-15% height loss, new since prior exam dated 10/21/2022.  No evidence of osseous retropulsion.  The severe remote compression fracture of L1 was not significantly changed.  Posterior fusion T11-L3 with pedicle screws at T6 11, T12, L2 and L3 appeared to be intact with stable alignment.  ROS:   Review of Systems  Constitutional: Negative.   HENT: Negative.    Eyes: Negative.    Respiratory: Negative.    Cardiovascular: Negative.   Gastrointestinal: Negative.   Genitourinary: Negative.   Musculoskeletal:  Positive for back pain and falls.  Skin: Negative.   Neurological: Negative.   Endo/Heme/Allergies: Negative.   Psychiatric/Behavioral: Negative.      Past Medical History:   Past Medical History:  Diagnosis Date   Anxiety    Headache    Hypertension    Hypothyroidism    Lesion of lip    right lower lip   OCD (obsessive compulsive disorder)     Past Surgical History:   Past Surgical History:  Procedure Laterality Date   ABDOMINAL HYSTERECTOMY     APPENDECTOMY     CESAREAN SECTION     x2   LESION REMOVAL Right 12/01/2015   Procedure: EXCISION LESION RIGHT LOWER LIP;  Surgeon: Phyllis Breeze, MD;  [facility]:  [facility];  Service: Plastics;  Laterality: Right;  right lower lip    LIPOMA EXCISION     left elbow   LUMBAR PERCUTANEOUS PEDICLE SCREW 1 LEVEL N/A 12/06/2021   Procedure: Open Reduction Internal Fixation  Thoracic Eleven - Lumbar Three, Thoracic Twelve - Lumbar Two Posterior Lateral Arthrodesis, LAMINECTOMY Lumbar one;  Surgeon: Audie Bleacher, MD;  Location: [facility] OR;  Service: Neurosurgery;  Laterality: N/A;   SHOULDER ARTHROSCOPY Left     Social History:   Social History   Socioeconomic History   Marital status: Married    Spouse name: Not on file   Number of children: Not on file  Years of education: Not on file   Highest education level: Not on file  Occupational History   Not on file  Tobacco Use   Smoking status: Never   Smokeless tobacco: Not on file  Vaping Use   Vaping status: Never Used  Substance and Sexual Activity   Alcohol use: Yes    Comment: social   Drug use: No   Sexual activity: Yes    Birth control/protection: Surgical  Other Topics Concern   Not on file  Social History Narrative   Not on file   Social Drivers of Health   Financial Resource Strain: Not on file  Food Insecurity:  Not on file  Transportation Needs: Not on file  Physical Activity: Not on file  Stress: Not on file  Social Connections: Not on file  Intimate Partner Violence: Not on file    Allergies   Somatropin  Family history:   History reviewed. No pertinent family history.  Current Medications:   Prior to Admission medications   Medication Sig Start Date End Date Taking? Authorizing Provider  citalopram  (CELEXA ) 40 MG tablet Take 40 mg by mouth daily. 10/27/21  Yes [provider]  hydrochlorothiazide  (MICROZIDE ) 12.5 MG capsule Take 12.5 mg by mouth daily.   Yes [provider]  ketorolac (TORADOL) 10 MG tablet Take 10 mg by mouth every 6 (six) hours as needed. 03/11/24  Yes [provider]  SUMAtriptan  (IMITREX ) 50 MG tablet Take 50 mg by mouth every 2 (two) hours as needed for migraine. May repeat in 2 hours if headache persists or recurs.   Yes [provider]  temazepam (RESTORIL) 30 MG capsule Take 30 mg by mouth at bedtime.   Yes [provider]  tiZANidine  (ZANAFLEX ) 4 MG tablet Take 4 mg by mouth every 6 (six) hours as needed for muscle spasms.   Yes [provider]  levothyroxine  (SYNTHROID ) 100 MCG tablet Take 100 mcg by mouth daily before breakfast.    [provider]    Physical Exam:   Vitals:   03/12/24 1915 03/12/24 1930 03/12/24 1945 03/12/24 1952  BP:      Pulse: 88 86 86   Resp: 13 19 15    Temp:    98.2 F (36.8 C)  TempSrc:    Oral  SpO2: 90% 92% 93%   Weight:      Height:         Physical Exam: Blood pressure (!) 184/96, pulse 86, temperature 98.2 F (36.8 C), temperature source Oral, resp. rate 15, height 5' 6 (1.676 m), weight 64.9 kg, SpO2 93%. Gen: No acute distress. Head: Normocephalic, atraumatic. Eyes: Pupils equal, round and reactive to light. Extraocular movements intact.  Sclerae nonicteric. No lid lag. Mouth: Oropharynx reveals moist mucous membranes. Dentition is good. Neck:  Supple, no thyromegaly, no lymphadenopathy, no jugular venous distention. Chest: Lungs are clear to auscultation with good air movement. No rales, rhonchi or wheezes.  CV: Heart sounds are regular with an S1, S2. No murmurs, rubs, clicks, or gallops.  Abdomen: Soft, nontender, nondistended with normal active bowel sounds. No hepatosplenomegaly or palpable masses. Extremities: Extremities are without clubbing, or cyanosis. No edema. Pedal pulses 2+.  Skin: Warm and dry. No rashes, lesions or wounds. Neuro: Alert and oriented times 3; grossly nonfocal.  Psych: Insight is good and judgment is appropriate. Mood and affect normal.   Data Review:    Labs: Basic Metabolic Panel: Recent Labs  Lab 03/12/24 1637  NA 126*  K  3.3*  CL 87*  CO2 27  GLUCOSE 113*  BUN 11  CREATININE 0.96  CALCIUM 9.3    Urinalysis    Component Value Date/Time   COLORURINE STRAW (A) 03/12/2024 1752   APPEARANCEUR CLEAR 03/12/2024 1752   APPEARANCEUR Clear 02/10/2022 1159   LABSPEC 1.004 (L) 03/12/2024 1752   PHURINE 6.0 03/12/2024 1752   GLUCOSEU 50 (A) 03/12/2024 1752   HGBUR NEGATIVE 03/12/2024 1752   BILIRUBINUR NEGATIVE 03/12/2024 1752   BILIRUBINUR Negative 02/10/2022 1159   KETONESUR NEGATIVE 03/12/2024 1752   PROTEINUR NEGATIVE 03/12/2024 1752   NITRITE NEGATIVE 03/12/2024 1752   LEUKOCYTESUR NEGATIVE 03/12/2024 1752    Radiographic Studies: DG Lumbar Spine Complete Result Date: 03/12/2024 CLINICAL DATA:  Fall. EXAM: LUMBAR SPINE - COMPLETE 4+ VIEW; SACRUM AND COCCYX - 2+ VIEW COMPARISON:  Lumbar spine radiographs dated 10/21/2022. FINDINGS: Diffuse osseous demineralization. Transitional lumbosacral anatomy is again noted with sacralization of L6. Posterior fusion T11-L3 with pedicle screws at T11, T12, L2, and L3. Hardware appears intact with stable alignment. No significant periprosthetic lucency identified. Redemonstrated severe remote compression fracture of L1, not significantly changed  compared to the prior exam. Age indeterminate wedge deformity of the L5 vertebral body with approximately 10-15% height loss. No evidence of osseous retropulsion. Similar grade 1 retrolisthesis of L3 on L4. Multilevel degenerative changes with facet arthropathy of the mid to lower lumbar spine. Sacroiliac joints and pubic symphysis are intact and anatomically aligned. Femoral heads are seated within the acetabula. IMPRESSION: 1. Age indeterminate wedge deformity of the L5 vertebral body with approximately 10-15% height loss, new since the prior exam dated 10/21/2022. No evidence of osseous retropulsion. 2. Redemonstrated severe remote compression fracture of L1, not significantly changed compared to the prior exam. 3. Posterior fusion T11-L3 with pedicle screws at T11, T12, L2, and L3. Hardware appears intact with stable alignment. 4. Multilevel degenerative changes with facet arthropathy of the mid to lower lumbar spine. Electronically Signed   By: Mannie Seek M.D.   On: 03/12/2024 13:04   DG Sacrum/Coccyx Result Date: 03/12/2024 CLINICAL DATA:  Fall. EXAM: LUMBAR SPINE - COMPLETE 4+ VIEW; SACRUM AND COCCYX - 2+ VIEW COMPARISON:  Lumbar spine radiographs dated 10/21/2022. FINDINGS: Diffuse osseous demineralization. Transitional lumbosacral anatomy is again noted with sacralization of L6. Posterior fusion T11-L3 with pedicle screws at T11, T12, L2, and L3. Hardware appears intact with stable alignment. No significant periprosthetic lucency identified. Redemonstrated severe remote compression fracture of L1, not significantly changed compared to the prior exam. Age indeterminate wedge deformity of the L5 vertebral body with approximately 10-15% height loss. No evidence of osseous retropulsion. Similar grade 1 retrolisthesis of L3 on L4. Multilevel degenerative changes with facet arthropathy of the mid to lower lumbar spine. Sacroiliac joints and pubic symphysis are intact and anatomically aligned. Femoral  heads are seated within the acetabula. IMPRESSION: 1. Age indeterminate wedge deformity of the L5 vertebral body with approximately 10-15% height loss, new since the prior exam dated 10/21/2022. No evidence of osseous retropulsion. 2. Redemonstrated severe remote compression fracture of L1, not significantly changed compared to the prior exam. 3. Posterior fusion T11-L3 with pedicle screws at T11, T12, L2, and L3. Hardware appears intact with stable alignment. 4. Multilevel degenerative changes with facet arthropathy of the mid to lower lumbar spine. Electronically Signed   By: Mannie Seek M.D.   On: 03/12/2024 13:04     Assessment/Plan:   Principal Problem:   Closed compression fracture of L5 lumbar vertebra, initial encounter (HCC)/sacral  back pain Imaging as noted above.  Patient will need an aggressive pain management strategy until she can follow-up with her neurosurgeon for evaluation of more definitive therapy.  Active Problems:   Hyponatremia/Hypothyroidism Unclear etiology.  She has been on an SSRI for a long period of time so it is unlikely to be an SSRI side effect.  She denies excessive water intake.  There are no lung concerns that could trigger an SIADH reaction.  Random urine sodium is elevated, indicative that the kidneys are not conserving sodium which could indicate euvolemic hyponatremia as would be seen in SIADH.  Less likely is hypervolemic hyponatremia as the patient does not have any past history of heart failure or cirrhosis.  She does take hydrochlorothiazide  which can increase urine sodium and confound the assessment.  She appears euvolemic clinically.  Urine specific gravity is low.  Will measure urine osmolality but this likely is due to SIADH with severe hyperthyroidism or glucocorticoid deficiency in the differential.  Will check a TSH as well, since she has known hypothyroidism to rule out over replacement.    Glucosuria with normal serum glucose This most likely  reflects benign familial renal glucosuria which is the most common cause of glucosuria when the serum glucose is normal or only mildly elevated.  She is not on an SGLT2 inhibitor to explain it.  Could also be a laboratory error.  Will check an A1c but would not work this up unless there is another indication.    Elevated blood pressure reading with diagnosis of hypertension Patient's marked elevation of her blood pressure in the context of treated hypertension likely reflects severe pain from her compression fracture.  Will work on pain management.  HIV screening The patient falls between the ages of 13-64 and should be screened for HIV, therefore HIV testing ordered.  Body mass index is 23.09 kg/m.  Other information:   DVT prophylaxis: Lovenox ordered. Code Status: Full code. Family Communication: Husband at the bedside. Disposition Plan: Home tomorrow. Consults called: None. Admission status: Observation.  The medical decision making on this patient was of high complexity and the patient is at high risk for clinical deterioration, therefore this is a level 3 visit.    Craige Dixon Aarnav Steagall [facility] Pager [phone]   How to contact the [facility] Attending or Consulting provider 7A - 7P or covering provider during after hours 7P -7A, for this patient?   Check the care team in [facility] and look for a) attending/consulting [facility] provider listed and b) the [facility] team listed Log into [email or web address] and use 's universal password to access. If you do not have the password, please contact the hospital operator. Locate the [facility] provider you are looking for under [facility] and page to a number that you can be directly reached. If you still have difficulty reaching the provider, please page the [facility] (Director on Call) for the Hospitalists listed on amion for assistance.  03/12/2024, 8:09 PM

## 2024-03-12 NOTE — ED Notes (Signed)
 Patient transported to x-ray. ?

## 2024-03-12 NOTE — H&P (Signed)
 History and Physical:    Destiny Bennett   ZOX:[id number] DOB: 1960-10-07 DOA: 03/12/2024  Referring MD/provider: Katherine Pancake PCP: Caresse Chant, FNP   Patient coming from: Home  Chief Complaint: Fall, sacral pain  History of Present Illness:   Destiny Bennett is an 63 y.o. female with a PMH of hypertension, hypothyroidism, OCD and an L5 compression fracture status postsurgical repair who suffered from a mechanical fall when attempting to get into a truck yesterday.  The floorboard that she stepped onto gave way and she fell backwards landing on her tailbone.  Because of significant pain unrelieved by home medications, she presented to the ED for pain management and to rule out issues with her hardware.  She is currently rating the pain 5-6/10 and worse with any movement.  ED Course:  The patient's vital signs on presentation were: Blood pressure (!) 184/96, pulse 86, temperature 98.2 F (36.8 C), temperature source Oral, resp. rate 15, height 5' 6 (1.676 m), weight 64.9 kg, SpO2 93%.  With several doses of IV Dilaudid , Robaxin , and a 10 mg dose of Decadron , her pain came down to a level 3/10.  Because of persistent hypertension and some abnormal labs, the ED physician referred her for a further evaluation and workup.  Labs showed a new hyponatremia at 126, potassium 3.3, chloride 87, bicarb 27, glucose 113, BUN 11, creatinine 0.96.  Urinalysis was unremarkable other than 50 mg/dL of glucose in the urine.  Random urine sodium was 49.  Imaging showed an age-indeterminate wedge deformity of the L5 vertebral body with 10-15% height loss, new since prior exam dated 10/21/2022.  No evidence of osseous retropulsion.  The severe remote compression fracture of L1 was not significantly changed.  Posterior fusion T11-L3 with pedicle screws at T6 11, T12, L2 and L3 appeared to be intact with stable alignment.  ROS:   Review of Systems  Constitutional: Negative.   HENT: Negative.    Eyes: Negative.    Respiratory: Negative.    Cardiovascular: Negative.   Gastrointestinal: Negative.   Genitourinary: Negative.   Musculoskeletal:  Positive for back pain and falls.  Skin: Negative.   Neurological: Negative.   Endo/Heme/Allergies: Negative.   Psychiatric/Behavioral: Negative.      Past Medical History:   Past Medical History:  Diagnosis Date   Anxiety    Headache    Hypertension    Hypothyroidism    Lesion of lip    right lower lip   OCD (obsessive compulsive disorder)     Past Surgical History:   Past Surgical History:  Procedure Laterality Date   ABDOMINAL HYSTERECTOMY     APPENDECTOMY     CESAREAN SECTION     x2   LESION REMOVAL Right 12/01/2015   Procedure: EXCISION LESION RIGHT LOWER LIP;  Surgeon: Phyllis Breeze, MD;  [facility]:  [facility];  Service: Plastics;  Laterality: Right;  right lower lip    LIPOMA EXCISION     left elbow   LUMBAR PERCUTANEOUS PEDICLE SCREW 1 LEVEL N/A 12/06/2021   Procedure: Open Reduction Internal Fixation  Thoracic Eleven - Lumbar Three, Thoracic Twelve - Lumbar Two Posterior Lateral Arthrodesis, LAMINECTOMY Lumbar one;  Surgeon: Audie Bleacher, MD;  Location: [facility] OR;  Service: Neurosurgery;  Laterality: N/A;   SHOULDER ARTHROSCOPY Left     Social History:   Social History   Socioeconomic History   Marital status: Married    Spouse name: Not on file   Number of children: Not on file  Years of education: Not on file   Highest education level: Not on file  Occupational History   Not on file  Tobacco Use   Smoking status: Never   Smokeless tobacco: Not on file  Vaping Use   Vaping status: Never Used  Substance and Sexual Activity   Alcohol use: Yes    Comment: social   Drug use: No   Sexual activity: Yes    Birth control/protection: Surgical  Other Topics Concern   Not on file  Social History Narrative   Not on file   Social Drivers of Health   Financial Resource Strain: Not on file  Food Insecurity:  Not on file  Transportation Needs: Not on file  Physical Activity: Not on file  Stress: Not on file  Social Connections: Not on file  Intimate Partner Violence: Not on file    Allergies   Somatropin  Family history:   History reviewed. No pertinent family history.  Current Medications:   Prior to Admission medications   Medication Sig Start Date End Date Taking? Authorizing Provider  citalopram  (CELEXA ) 40 MG tablet Take 40 mg by mouth daily. 10/27/21  Yes [provider]  hydrochlorothiazide  (MICROZIDE ) 12.5 MG capsule Take 12.5 mg by mouth daily.   Yes [provider]  ketorolac (TORADOL) 10 MG tablet Take 10 mg by mouth every 6 (six) hours as needed. 03/11/24  Yes [provider]  SUMAtriptan  (IMITREX ) 50 MG tablet Take 50 mg by mouth every 2 (two) hours as needed for migraine. May repeat in 2 hours if headache persists or recurs.   Yes [provider]  temazepam (RESTORIL) 30 MG capsule Take 30 mg by mouth at bedtime.   Yes [provider]  tiZANidine  (ZANAFLEX ) 4 MG tablet Take 4 mg by mouth every 6 (six) hours as needed for muscle spasms.   Yes [provider]  levothyroxine  (SYNTHROID ) 100 MCG tablet Take 100 mcg by mouth daily before breakfast.    [provider]    Physical Exam:   Vitals:   03/12/24 1915 03/12/24 1930 03/12/24 1945 03/12/24 1952  BP:      Pulse: 88 86 86   Resp: 13 19 15    Temp:    98.2 F (36.8 C)  TempSrc:    Oral  SpO2: 90% 92% 93%   Weight:      Height:         Physical Exam: Blood pressure (!) 184/96, pulse 86, temperature 98.2 F (36.8 C), temperature source Oral, resp. rate 15, height 5' 6 (1.676 m), weight 64.9 kg, SpO2 93%. Gen: No acute distress. Head: Normocephalic, atraumatic. Eyes: Pupils equal, round and reactive to light. Extraocular movements intact.  Sclerae nonicteric. No lid lag. Mouth: Oropharynx reveals moist mucous membranes. Dentition is good. Neck:  Supple, no thyromegaly, no lymphadenopathy, no jugular venous distention. Chest: Lungs are clear to auscultation with good air movement. No rales, rhonchi or wheezes.  CV: Heart sounds are regular with an S1, S2. No murmurs, rubs, clicks, or gallops.  Abdomen: Soft, nontender, nondistended with normal active bowel sounds. No hepatosplenomegaly or palpable masses. Extremities: Extremities are without clubbing, or cyanosis. No edema. Pedal pulses 2+.  Skin: Warm and dry. No rashes, lesions or wounds. Neuro: Alert and oriented times 3; grossly nonfocal.  Psych: Insight is good and judgment is appropriate. Mood and affect normal.   Data Review:    Labs: Basic Metabolic Panel: Recent Labs  Lab 03/12/24 1637  NA 126*  K  3.3*  CL 87*  CO2 27  GLUCOSE 113*  BUN 11  CREATININE 0.96  CALCIUM 9.3    Urinalysis    Component Value Date/Time   COLORURINE STRAW (A) 03/12/2024 1752   APPEARANCEUR CLEAR 03/12/2024 1752   APPEARANCEUR Clear 02/10/2022 1159   LABSPEC 1.004 (L) 03/12/2024 1752   PHURINE 6.0 03/12/2024 1752   GLUCOSEU 50 (A) 03/12/2024 1752   HGBUR NEGATIVE 03/12/2024 1752   BILIRUBINUR NEGATIVE 03/12/2024 1752   BILIRUBINUR Negative 02/10/2022 1159   KETONESUR NEGATIVE 03/12/2024 1752   PROTEINUR NEGATIVE 03/12/2024 1752   NITRITE NEGATIVE 03/12/2024 1752   LEUKOCYTESUR NEGATIVE 03/12/2024 1752    Radiographic Studies: DG Lumbar Spine Complete Result Date: 03/12/2024 CLINICAL DATA:  Fall. EXAM: LUMBAR SPINE - COMPLETE 4+ VIEW; SACRUM AND COCCYX - 2+ VIEW COMPARISON:  Lumbar spine radiographs dated 10/21/2022. FINDINGS: Diffuse osseous demineralization. Transitional lumbosacral anatomy is again noted with sacralization of L6. Posterior fusion T11-L3 with pedicle screws at T11, T12, L2, and L3. Hardware appears intact with stable alignment. No significant periprosthetic lucency identified. Redemonstrated severe remote compression fracture of L1, not significantly changed  compared to the prior exam. Age indeterminate wedge deformity of the L5 vertebral body with approximately 10-15% height loss. No evidence of osseous retropulsion. Similar grade 1 retrolisthesis of L3 on L4. Multilevel degenerative changes with facet arthropathy of the mid to lower lumbar spine. Sacroiliac joints and pubic symphysis are intact and anatomically aligned. Femoral heads are seated within the acetabula. IMPRESSION: 1. Age indeterminate wedge deformity of the L5 vertebral body with approximately 10-15% height loss, new since the prior exam dated 10/21/2022. No evidence of osseous retropulsion. 2. Redemonstrated severe remote compression fracture of L1, not significantly changed compared to the prior exam. 3. Posterior fusion T11-L3 with pedicle screws at T11, T12, L2, and L3. Hardware appears intact with stable alignment. 4. Multilevel degenerative changes with facet arthropathy of the mid to lower lumbar spine. Electronically Signed   By: Mannie Seek M.D.   On: 03/12/2024 13:04   DG Sacrum/Coccyx Result Date: 03/12/2024 CLINICAL DATA:  Fall. EXAM: LUMBAR SPINE - COMPLETE 4+ VIEW; SACRUM AND COCCYX - 2+ VIEW COMPARISON:  Lumbar spine radiographs dated 10/21/2022. FINDINGS: Diffuse osseous demineralization. Transitional lumbosacral anatomy is again noted with sacralization of L6. Posterior fusion T11-L3 with pedicle screws at T11, T12, L2, and L3. Hardware appears intact with stable alignment. No significant periprosthetic lucency identified. Redemonstrated severe remote compression fracture of L1, not significantly changed compared to the prior exam. Age indeterminate wedge deformity of the L5 vertebral body with approximately 10-15% height loss. No evidence of osseous retropulsion. Similar grade 1 retrolisthesis of L3 on L4. Multilevel degenerative changes with facet arthropathy of the mid to lower lumbar spine. Sacroiliac joints and pubic symphysis are intact and anatomically aligned. Femoral  heads are seated within the acetabula. IMPRESSION: 1. Age indeterminate wedge deformity of the L5 vertebral body with approximately 10-15% height loss, new since the prior exam dated 10/21/2022. No evidence of osseous retropulsion. 2. Redemonstrated severe remote compression fracture of L1, not significantly changed compared to the prior exam. 3. Posterior fusion T11-L3 with pedicle screws at T11, T12, L2, and L3. Hardware appears intact with stable alignment. 4. Multilevel degenerative changes with facet arthropathy of the mid to lower lumbar spine. Electronically Signed   By: Mannie Seek M.D.   On: 03/12/2024 13:04     Assessment/Plan:   Principal Problem:   Closed compression fracture of L5 lumbar vertebra, initial encounter (HCC)/sacral  back pain Imaging as noted above.  Patient will need an aggressive pain management strategy until she can follow-up with her neurosurgeon for evaluation of more definitive therapy.  Active Problems:   Hyponatremia/Hypothyroidism Unclear etiology.  She has been on an SSRI for a long period of time so it is unlikely to be an SSRI side effect.  She denies excessive water intake.  There are no lung concerns that could trigger an SIADH reaction.  Random urine sodium is elevated, indicative that the kidneys are not conserving sodium which could indicate euvolemic hyponatremia as would be seen in SIADH.  Less likely is hypervolemic hyponatremia as the patient does not have any past history of heart failure or cirrhosis.  She does take hydrochlorothiazide  which can increase urine sodium and confound the assessment.  She appears euvolemic clinically.  Urine specific gravity is low.  Will measure urine osmolality but this likely is due to SIADH with severe hyperthyroidism or glucocorticoid deficiency in the differential.  Will check a TSH as well, since she has known hypothyroidism to rule out over replacement.    Glucosuria with normal serum glucose This most likely  reflects benign familial renal glucosuria which is the most common cause of glucosuria when the serum glucose is normal or only mildly elevated.  She is not on an SGLT2 inhibitor to explain it.  Could also be a laboratory error.  Will check an A1c but would not work this up unless there is another indication.    Elevated blood pressure reading with diagnosis of hypertension Patient's marked elevation of her blood pressure in the context of treated hypertension likely reflects severe pain from her compression fracture.  Will work on pain management.  HIV screening The patient falls between the ages of 13-64 and should be screened for HIV, therefore HIV testing ordered.  Body mass index is 23.09 kg/m.  Other information:   DVT prophylaxis: Lovenox ordered. Code Status: Full code. Family Communication: Husband at the bedside. Disposition Plan: Home tomorrow. Consults called: None. Admission status: Observation.  The medical decision making on this patient was of high complexity and the patient is at high risk for clinical deterioration, therefore this is a level 3 visit.    Craige Dixon Aarnav Steagall [facility] Pager [phone]   How to contact the [facility] Attending or Consulting provider 7A - 7P or covering provider during after hours 7P -7A, for this patient?   Check the care team in [facility] and look for a) attending/consulting [facility] provider listed and b) the [facility] team listed Log into [email or web address] and use 's universal password to access. If you do not have the password, please contact the hospital operator. Locate the [facility] provider you are looking for under [facility] and page to a number that you can be directly reached. If you still have difficulty reaching the provider, please page the [facility] (Director on Call) for the Hospitalists listed on amion for assistance.  03/12/2024, 8:09 PM

## 2024-03-12 NOTE — ED Triage Notes (Signed)
 Pt arrived via POV c/o injury to her lower back from a fall yesterday. Pt reports she fell back off a step while climbing into her truck. Pt reports previous lower back injury and hardware. Pt concerned she may have re-injured her lower back. Pt reports UC yesterday gave er Toradol, with minimal relief. Pt reports elevated BP and denies LOC.

## 2024-03-12 NOTE — ED Provider Notes (Addendum)
 [place] EMERGENCY DEPARTMENT AT [facility] Provider Note   CSN: [id number] Arrival date & time: 03/12/24  1030     Patient presents with: Destiny Bennett   Destiny Bennett is a 63 y.o. female with a history of hypertension and hypothyroidism, surgical history significant for lumbar internal fixation and arthrodesis secondary to L1 burst fracture in 2023 under the care of Dr. Michale Age presenting with low back pain after a fall which occurred yesterday.  She was attempting to get into a truck and the running board collapsed causing her to fall backwards, landing directly on her lumbar/sacral area.  She had significant immediate pain which worsened today, stating she is unable to tolerate weightbearing secondary to pain.  She denies weakness or numbness in her legs and has had no urinary or fecal incontinence or retention.  She was seen at urgent care yesterday and received Toradol which offered minimal relief.  She is also noticed that her blood pressure has been elevated today, she has been compliant with her BP medication of hydrochlorothiazide  12.5 mg.  She denies headache, chest pain, shortness of breath, dizziness, vision changes.  She denies head or neck injury with yesterday's fall.   The history is provided by the patient.       Prior to Admission medications   Medication Sig Start Date End Date Taking? Authorizing Provider  citalopram  (CELEXA ) 40 MG tablet Take 40 mg by mouth daily. 10/27/21   [provider]  hydrochlorothiazide  (MICROZIDE ) 12.5 MG capsule Take 12.5 mg by mouth daily.    [provider]  levothyroxine  (SYNTHROID , LEVOTHROID) 50 MCG tablet Take 50 mcg by mouth daily before breakfast.    [provider]  ondansetron  (ZOFRAN ) 4 MG tablet Take by mouth daily. 08/12/21   [provider]  SUMAtriptan  (IMITREX ) 50 MG tablet Take 50 mg by mouth every 2 (two) hours as needed for migraine. May repeat in 2 hours if headache persists or recurs.     [provider]  tamsulosin  (FLOMAX ) 0.4 MG CAPS capsule Take 1 capsule (0.4 mg total) by mouth daily. 12/31/21   Summerlin, Julienne Annette, PA-C  temazepam (RESTORIL) 30 MG capsule Take 30 mg by mouth at bedtime as needed for sleep.    [provider]  tiZANidine  (ZANAFLEX ) 4 MG tablet Take 1 tablet (4 mg total) by mouth every 6 (six) hours as needed for muscle spasms. 12/09/21   Audie Bleacher, MD  zolpidem  (AMBIEN ) 10 MG tablet Take 10 mg by mouth at bedtime as needed for sleep. 10/27/21   [provider]    Allergies: Patient has no known allergies.    Review of Systems  Constitutional:  Negative for chills and fever.  HENT:  Negative for congestion and sore throat.   Eyes: Negative.  Negative for visual disturbance.  Respiratory:  Negative for chest tightness and shortness of breath.   Cardiovascular:  Negative for chest pain.  Gastrointestinal:  Negative for abdominal pain and nausea.  Genitourinary: Negative.   Musculoskeletal:  Positive for back pain. Negative for arthralgias, joint swelling and neck pain.  Skin: Negative.  Negative for rash and wound.  Neurological:  Negative for dizziness, weakness, light-headedness, numbness and headaches.  Psychiatric/Behavioral: Negative.      Updated Vital Signs BP (!) 184/96   Pulse 84   Temp 98.3 F (36.8 C) (Oral)   Resp 16   Ht 5' 6 (1.676 m)   Wt 64.9 kg   SpO2 (!) 89%   BMI 23.09 kg/m  Physical Exam Vitals and nursing note reviewed.  Constitutional:      Appearance: She is well-developed.  HENT:     Head: Normocephalic.   Eyes:     Conjunctiva/sclera: Conjunctivae normal.    Cardiovascular:     Rate and Rhythm: Normal rate.     Pulses: Normal pulses.     Comments: Pedal pulses normal. Pulmonary:     Effort: Pulmonary effort is normal.  Abdominal:     General: Bowel sounds are normal. There is no distension.     Palpations: Abdomen is soft. There is no mass.   Musculoskeletal:         General: Normal range of motion.     Cervical back: Normal, normal range of motion and neck supple.     Thoracic back: Normal.     Lumbar back: Bony tenderness present. No swelling, edema, deformity or spasms.       Back:     Comments: Patient is point tender midline low lumbar upper sacral region.  She has no visible signs of trauma including no edema or bruising.   Skin:    General: Skin is warm and dry.   Neurological:     Mental Status: She is alert.     Sensory: No sensory deficit.     Motor: No tremor or atrophy.     Gait: Gait normal.     Comments: No strength deficit noted in hip and knee flexor and extensor muscle groups.  Ankle flexion and extension intact. Unable to assess gait due to pain.    (all labs ordered are listed, but only abnormal results are displayed) Labs Reviewed  BASIC METABOLIC PANEL WITH GFR - Abnormal; Notable for the following components:      Result Value   Sodium 126 (*)    Potassium 3.3 (*)    Chloride 87 (*)    Glucose, Bld 113 (*)    All other components within normal limits  URINALYSIS, ROUTINE W REFLEX MICROSCOPIC  CBC  SODIUM, URINE, RANDOM    EKG: EKG Interpretation Date/Time:  Monday March 12 2024 11:44:25 EDT Ventricular Rate:  62 PR Interval:  152 QRS Duration:  98 QT Interval:  438 QTC Calculation: 445 R Axis:   85  Text Interpretation: Sinus rhythm Consider right atrial enlargement Borderline right axis deviation Confirmed by Rosealee Concha ([id number]) on 03/12/2024 3:58:28 PM  Radiology: Lenell Query Lumbar Spine Complete Result Date: 03/12/2024 CLINICAL DATA:  Fall. EXAM: LUMBAR SPINE - COMPLETE 4+ VIEW; SACRUM AND COCCYX - 2+ VIEW COMPARISON:  Lumbar spine radiographs dated 10/21/2022. FINDINGS: Diffuse osseous demineralization. Transitional lumbosacral anatomy is again noted with sacralization of L6. Posterior fusion T11-L3 with pedicle screws at T11, T12, L2, and L3. Hardware appears intact with stable alignment. No significant  periprosthetic lucency identified. Redemonstrated severe remote compression fracture of L1, not significantly changed compared to the prior exam. Age indeterminate wedge deformity of the L5 vertebral body with approximately 10-15% height loss. No evidence of osseous retropulsion. Similar grade 1 retrolisthesis of L3 on L4. Multilevel degenerative changes with facet arthropathy of the mid to lower lumbar spine. Sacroiliac joints and pubic symphysis are intact and anatomically aligned. Femoral heads are seated within the acetabula. IMPRESSION: 1. Age indeterminate wedge deformity of the L5 vertebral body with approximately 10-15% height loss, new since the prior exam dated 10/21/2022. No evidence of osseous retropulsion. 2. Redemonstrated severe remote compression fracture of L1, not significantly changed compared to the prior exam. 3. Posterior fusion T11-L3 with  pedicle screws at T11, T12, L2, and L3. Hardware appears intact with stable alignment. 4. Multilevel degenerative changes with facet arthropathy of the mid to lower lumbar spine. Electronically Signed   By: Mannie Seek M.D.   On: 03/12/2024 13:04   DG Sacrum/Coccyx Result Date: 03/12/2024 CLINICAL DATA:  Fall. EXAM: LUMBAR SPINE - COMPLETE 4+ VIEW; SACRUM AND COCCYX - 2+ VIEW COMPARISON:  Lumbar spine radiographs dated 10/21/2022. FINDINGS: Diffuse osseous demineralization. Transitional lumbosacral anatomy is again noted with sacralization of L6. Posterior fusion T11-L3 with pedicle screws at T11, T12, L2, and L3. Hardware appears intact with stable alignment. No significant periprosthetic lucency identified. Redemonstrated severe remote compression fracture of L1, not significantly changed compared to the prior exam. Age indeterminate wedge deformity of the L5 vertebral body with approximately 10-15% height loss. No evidence of osseous retropulsion. Similar grade 1 retrolisthesis of L3 on L4. Multilevel degenerative changes with facet arthropathy of  the mid to lower lumbar spine. Sacroiliac joints and pubic symphysis are intact and anatomically aligned. Femoral heads are seated within the acetabula. IMPRESSION: 1. Age indeterminate wedge deformity of the L5 vertebral body with approximately 10-15% height loss, new since the prior exam dated 10/21/2022. No evidence of osseous retropulsion. 2. Redemonstrated severe remote compression fracture of L1, not significantly changed compared to the prior exam. 3. Posterior fusion T11-L3 with pedicle screws at T11, T12, L2, and L3. Hardware appears intact with stable alignment. 4. Multilevel degenerative changes with facet arthropathy of the mid to lower lumbar spine. Electronically Signed   By: Mannie Seek M.D.   On: 03/12/2024 13:04     Procedures   Medications Ordered in the ED  HYDROmorphone  (DILAUDID ) injection 1 mg (1 mg Intramuscular Given 03/12/24 1212)  ondansetron  (ZOFRAN -ODT) disintegrating tablet 4 mg (4 mg Oral Given 03/12/24 1211)  HYDROmorphone  (DILAUDID ) injection 1 mg (1 mg Intravenous Given 03/12/24 1541)  dexamethasone  (DECADRON ) injection 10 mg (10 mg Intravenous Given 03/12/24 1525)  methocarbamol  (ROBAXIN ) tablet 750 mg (750 mg Oral Given 03/12/24 1515)  sodium chloride  0.9 % bolus 1,000 mL (1,000 mLs Intravenous Bolus from Bag 03/12/24 1730)    Clinical Course as of 03/12/24 1758  Mon Mar 12, 2024  1753 Initially pt given IM dilaudid  with transient but minimal improvement in pain.  Her BP remains elevated,  suspect secondary to pain. Pt denies headache,  cp,  sob,  no vision changes.  EKG and basic labs ordered to confirm no end organ damage.  Bmet surprising for hyponatremia at 126.  No prior hx.  Pt denies excessive thirst or increased fluid intake.  Husband states she salts everything.    Given repeat dose of dilaudid , along with toradol IV , robaxin  PO.  Pain now 4/10 and has been able to ambulate to bathroom.  No foot drop or other neuro deficit.    Discussed admission  for Na+ replacement - pt agreeable.  NS added 1 L at 500/hr. [JI]    Clinical Course User Index [JI] Alyse July                                 Medical Decision Making Pt with L5 compression fracture after fall occurring ytd.  Also with persistent elevated BP, has been compliant with her home bp meds.  No prior hx of hyponatremia.  Pt will be admitted for Na replacement and is agreeable.  She does have her clamshell brace fro prior  lumbar surgery - she states can wear this with new injury.  She was advised she should f/u with Dr. Michale Age regarding intervention/possible kyphoplasty for this new injury.    Amount and/or Complexity of Data Reviewed Labs: ordered.    Details: Bmet sig for hyponatremia - cbc pending.  Radiology: ordered.    Details: LS - L1 compression fracture. ECG/medicine tests: ordered.    Details: Normal sinus rhythm, rate 62 Discussion of management or test interpretation with external provider(s): Patient discussed with Dr. Odella Bending who accepts patient for admission.  Risk Prescription drug management. Decision regarding hospitalization.        Final diagnoses:  Hyponatremia  Closed compression fracture of body of L1 vertebra [facility])    ED Discharge Orders     None          Alyse July 03/12/24 1808    Katherine Pancake, PA-C 03/12/24 1809    Rosealee Concha, MD 03/12/24 [phone]

## 2024-03-13 DIAGNOSIS — S32010A Wedge compression fracture of first lumbar vertebra, initial encounter for closed fracture: Secondary | ICD-10-CM | POA: Diagnosis not present

## 2024-03-13 LAB — BASIC METABOLIC PANEL WITH GFR
Anion gap: 11 (ref 5–15)
BUN: 11 mg/dL (ref 8–23)
CO2: 26 mmol/L (ref 22–32)
Calcium: 9.1 mg/dL (ref 8.9–10.3)
Chloride: 93 mmol/L — ABNORMAL LOW (ref 98–111)
Creatinine, Ser: 1.01 mg/dL — ABNORMAL HIGH (ref 0.44–1.00)
GFR, Estimated: >60 mL/min (ref >60)
Glucose, Bld: 144 mg/dL — ABNORMAL HIGH (ref 70–99)
Potassium: 3.2 mmol/L — ABNORMAL LOW (ref 3.5–5.1)
Sodium: 130 mmol/L — ABNORMAL LOW (ref 135–145)

## 2024-03-13 LAB — HEMOGLOBIN A1C
Hgb A1c MFr Bld: 4.6 % — ABNORMAL LOW (ref 4.8–5.6)
Mean Plasma Glucose: 85.32 mg/dL

## 2024-03-13 LAB — HIV ANTIBODY (ROUTINE TESTING W REFLEX): HIV Screen 4th Generation wRfx: NONREACTIVE

## 2024-03-13 LAB — OSMOLALITY, URINE: Osmolality, Ur: 175 mosm/kg — ABNORMAL LOW (ref 300–900)

## 2024-03-13 MED ORDER — TIZANIDINE HCL 4 MG PO TABS: 4.0000 mg | ORAL_TABLET | Freq: Four times a day (QID) | ORAL | Status: DC | PRN

## 2024-03-13 MED ORDER — TEMAZEPAM 15 MG PO CAPS
30.0000 mg | ORAL_CAPSULE | Freq: Every day | ORAL | Status: DC
  Administered 2024-03-13: 30 mg via ORAL
  Filled 2024-03-13: qty 2

## 2024-03-13 MED ORDER — OXYCODONE HCL 5 MG PO TABS: 5.0000 mg | ORAL_TABLET | ORAL | 0 refills | Status: AC | PRN

## 2024-03-13 MED ORDER — BISACODYL 5 MG PO TBEC: 5.0000 mg | DELAYED_RELEASE_TABLET | Freq: Every day | ORAL | Status: DC | PRN

## 2024-03-13 MED ORDER — ONDANSETRON HCL 4 MG PO TABS
4.0000 mg | ORAL_TABLET | Freq: Four times a day (QID) | ORAL | Status: DC | PRN
Start: 2024-03-13 — End: 2024-03-13

## 2024-03-13 MED ORDER — CITALOPRAM HYDROBROMIDE 20 MG PO TABS
40.0000 mg | ORAL_TABLET | Freq: Every day | ORAL | Status: DC
  Administered 2024-03-13: 40 mg via ORAL
  Filled 2024-03-13: qty 2

## 2024-03-13 MED ORDER — OXYCODONE HCL 5 MG PO TABS: 5.0000 mg | ORAL_TABLET | ORAL | Status: DC | PRN

## 2024-03-13 MED ORDER — ENOXAPARIN SODIUM 40 MG/0.4ML IJ SOSY
40.0000 mg | PREFILLED_SYRINGE | INTRAMUSCULAR | Status: DC
Start: 2024-03-13 — End: 2024-03-13
  Administered 2024-03-13: 40 mg via SUBCUTANEOUS
  Filled 2024-03-13: qty 0.4

## 2024-03-13 MED ORDER — DOCUSATE SODIUM 100 MG PO CAPS
100.0000 mg | ORAL_CAPSULE | Freq: Two times a day (BID) | ORAL | Status: DC
  Administered 2024-03-13 (×2): 100 mg via ORAL
  Filled 2024-03-13 (×2): qty 1

## 2024-03-13 MED ORDER — TIZANIDINE HCL 4 MG PO TABS: 4.0000 mg | ORAL_TABLET | Freq: Four times a day (QID) | ORAL | 0 refills | Status: AC | PRN

## 2024-03-13 MED ORDER — ACETAMINOPHEN 325 MG PO TABS
650.0000 mg | ORAL_TABLET | Freq: Four times a day (QID) | ORAL | Status: DC | PRN
Start: 2024-03-13 — End: 2024-03-13

## 2024-03-13 MED ORDER — LEVOTHYROXINE SODIUM 100 MCG PO TABS
100.0000 ug | ORAL_TABLET | Freq: Every day | ORAL | Status: DC
  Administered 2024-03-13: 100 ug via ORAL
  Filled 2024-03-13: qty 1

## 2024-03-13 MED ORDER — ACETAMINOPHEN 650 MG RE SUPP: 650.0000 mg | Freq: Four times a day (QID) | RECTAL | Status: DC | PRN

## 2024-03-13 MED ORDER — POLYETHYLENE GLYCOL 3350 17 G PO PACK: 17.0000 g | PACK | Freq: Every day | ORAL | Status: DC | PRN

## 2024-03-13 MED ORDER — OXYCODONE HCL 5 MG PO TABS: 10.0000 mg | ORAL_TABLET | ORAL | Status: DC | PRN

## 2024-03-13 MED ORDER — METHOCARBAMOL 500 MG PO TABS
750.0000 mg | ORAL_TABLET | Freq: Once | ORAL | Status: AC
  Administered 2024-03-13: 750 mg via ORAL
  Filled 2024-03-13: qty 2

## 2024-03-13 MED ORDER — POTASSIUM CHLORIDE CRYS ER 20 MEQ PO TBCR
40.0000 meq | EXTENDED_RELEASE_TABLET | Freq: Two times a day (BID) | ORAL | Status: DC
  Administered 2024-03-13: 40 meq via ORAL
  Filled 2024-03-13: qty 2

## 2024-03-13 MED ORDER — ONDANSETRON HCL 4 MG/2ML IJ SOLN: 4.0000 mg | Freq: Four times a day (QID) | INTRAMUSCULAR | Status: DC | PRN

## 2024-03-13 NOTE — TOC Initial Note (Addendum)
 Transition of Care [facility]) - Inpatient Brief Assessment   Patient Details  Name: Destiny Bennett MRN: [id number] Date of Birth: 06-27-1961  [facility]) CM/SW Contact:    Orelia Binet, RN Phone Number: 03/13/2024, 9:13 AM   Clinical Narrative:  Patient admitted with Closed compression fracture of body of L1 . PT is requesting TLSO brace. CM called [employer], they will send someone out to measure and deliver the brace.   Addendum:  TLSO cancelled, Per MD patient is saying she has a brace from previous fracture. She will DC home today. [employer] updated.   Transition of Care Asessment: Insurance and Status: Insurance coverage has been reviewed Patient has primary care physician: Yes Home environment has been reviewed: Home with Spouse Prior level of function:: Independent Prior/Current Home Services: No current home services Social Drivers of Health Review: SDOH reviewed no interventions necessary Readmission risk has been reviewed: Yes Transition of care needs: no transition of care needs at this time               Barriers to Discharge: Continued Medical Work up   Patient Goals and CMS Choice Patient states their goals for this hospitalization and ongoing recovery are:: get better CMS Medicare.gov Compare Post Acute Care list provided to:: Patient Choice offered to / list presented to : Patient      Expected Discharge Plan and Services   Discharge Planning Services: CM Consult   Living arrangements for the past 2 months: Single Family Home                 DME Arranged:  (TLSO) DME Agency: Other - Comment (BIO - TECh) Date DME Agency Contacted: 03/13/24 Time DME Agency Contacted: [phone] Representative spoke with at DME Agency: Called IN     Prior Living Arrangements/Services Living arrangements for the past 2 months: Single Family Home Lives with:: Spouse Patient language and need for interpreter reviewed:: Yes Do you feel safe going back to the place  where you live?: Yes      Need for Family Participation in Patient Care: Yes (Comment) Care giver support system in place?: Yes (comment)   Criminal Activity/Legal Involvement Pertinent to Current Situation/Hospitalization: No - Comment as needed  Activities of Daily Living   ADL Screening (condition at time of admission) Independently performs ADLs?: Yes (appropriate for developmental age) Is the patient deaf or have difficulty hearing?: No Does the patient have difficulty seeing, even when wearing glasses/contacts?: No Does the patient have difficulty concentrating, remembering, or making decisions?: No     Orientation: : Oriented to Self, Oriented to Place, Oriented to  Time, Oriented to Situation Alcohol / Substance Use: Not Applicable Psych Involvement: No (comment)  Admission diagnosis:  Hyponatremia [E87.1] Closed compression fracture of L5 lumbar vertebra, initial encounter (HCC) [S32.050A] Closed compression fracture of body of L1 vertebra (HCC) [S32.010A] Patient Active Problem List   Diagnosis Date Noted   Closed compression fracture of body of L1 vertebra (HCC) 03/12/2024   Sacral back pain 03/12/2024   Hyponatremia 03/12/2024   Glucosuria with normal serum glucose 03/12/2024   Elevated blood pressure reading with diagnosis of hypertension 03/12/2024   Burst fracture of lumbar vertebra (HCC) 12/06/2021   Lumbar burst fracture (HCC) 12/05/2021   PCP:  Caresse Chant, FNP Pharmacy:   [facility] - [place], [place] - [address] [place] [address] Phone: [phone] Fax: [phone]    Social Drivers of Health (SDOH) Social History: SDOH  Screenings   Food Insecurity: No Food Insecurity (03/13/2024)  Housing: Low Risk  (03/13/2024)  Transportation Needs: No Transportation Needs (03/13/2024)  Utilities: Not At Risk (03/13/2024)  Tobacco Use: Unknown (03/12/2024)   SDOH Interventions:

## 2024-03-13 NOTE — Evaluation (Signed)
 Physical Therapy Evaluation Patient Details Name: Destiny Bennett MRN: [id number] DOB: 1961/01/14 Today's Date: 03/13/2024  History of Present Illness  Destiny Bennett is an 63 y.o. female with a PMH of hypertension, hypothyroidism, OCD and an L5 compression fracture status postsurgical repair who suffered from a mechanical fall when attempting to get into a truck yesterday.  The floorboard that she stepped onto gave way and she fell backwards landing on her tailbone.  Because of significant pain unrelieved by home medications, she presented to the ED for pain management and to rule out issues with her hardware.  She is currently rating the pain 5-6/10 and worse with any movement.   Clinical Impression  Patient demonstrates good return for rolling to side and sitting up from side lying position while avoiding twisting of low back, ambulated in room, hallways without problem and acknowledges understanding of back precautions. Plan:  Patient discharged from physical therapy to care of nursing for ambulation daily as tolerated for length of stay.         If plan is discharge home, recommend the following: Help with stairs or ramp for entrance;A little help with bathing/dressing/bathroom   Can travel by private vehicle        Equipment Recommendations None recommended by PT  Recommendations for Other Services       Functional Status Assessment Patient has had a recent decline in their functional status and/or demonstrates limited ability to make significant improvements in function in a reasonable and predictable amount of time     Precautions / Restrictions Precautions Precautions: None Required Braces or Orthoses: Spinal Brace Spinal Brace: Thoracolumbosacral orthotic Restrictions Weight Bearing Restrictions Per Provider Order: No      Mobility  Bed Mobility Overal bed mobility: Modified Independent                  Transfers Overall transfer level: Modified independent                       Ambulation/Gait Ambulation/Gait assistance: Modified independent (Device/Increase time) Gait Distance (Feet): 150 Feet Assistive device: None Gait Pattern/deviations: WFL(Within Functional Limits) Gait velocity: decreased     General Gait Details: grossly WFL wigh good return for ambulating in room, hallways without loss of balance  Stairs            Wheelchair Mobility     Tilt Bed    Modified Rankin (Stroke Patients Only)       Balance Overall balance assessment: No apparent balance deficits (not formally assessed)                                           Pertinent Vitals/Pain Pain Assessment Pain Assessment: 0-10 Pain Score: 7  Pain Location: low back Pain Descriptors / Indicators: Sore, Discomfort Pain Intervention(s): Limited activity within patient's tolerance, Monitored during session, Premedicated before session, Repositioned    Home Living Family/patient expects to be discharged to:: Private residence Living Arrangements: Spouse/significant other Available Help at Discharge: Family;Available 24 hours/day Type of Home: House Home Access: Stairs to enter Entrance Stairs-Rails: None Entrance Stairs-Number of Steps: 2   Home Layout: One level Home Equipment: Agricultural consultant (2 wheels)      Prior Function Prior Level of Function : Independent/Modified Independent;Driving             Mobility Comments: Community ambulation without AD, drives ADLs  Comments: Independent     Extremity/Trunk Assessment   Upper Extremity Assessment Upper Extremity Assessment: Overall WFL for tasks assessed    Lower Extremity Assessment Lower Extremity Assessment: Overall WFL for tasks assessed    Cervical / Trunk Assessment Cervical / Trunk Assessment: Normal  Communication   Communication Communication: No apparent difficulties    Cognition Arousal: Alert Behavior During Therapy: WFL for tasks  assessed/performed   PT - Cognitive impairments: No apparent impairments                         Following commands: Intact       Cueing Cueing Techniques: Verbal cues     General Comments      Exercises     Assessment/Plan    PT Assessment All further PT needs can be met in the next venue of care  PT Problem List Decreased activity tolerance;Decreased balance;Pain       PT Treatment Interventions      PT Goals (Current goals can be found in the Care Plan section)  Acute Rehab PT Goals Patient Stated Goal: return home with family to assist PT Goal Formulation: With patient Time For Goal Achievement: 03/13/24 Potential to Achieve Goals: Good    Frequency       Co-evaluation               AM-PAC PT 6 Clicks Mobility  Outcome Measure Help needed turning from your back to your side while in a flat bed without using bedrails?: None Help needed moving from lying on your back to sitting on the side of a flat bed without using bedrails?: None Help needed moving to and from a bed to a chair (including a wheelchair)?: None Help needed standing up from a chair using your arms (e.g., wheelchair or bedside chair)?: None Help needed to walk in hospital room?: None Help needed climbing 3-5 steps with a railing? : A Little 6 Click Score: 23    End of Session   Activity Tolerance: Patient tolerated treatment well Patient left: in bed;with call bell/phone within reach Nurse Communication: Mobility status PT Visit Diagnosis: Unsteadiness on feet (R26.81);Other abnormalities of gait and mobility (R26.89);Muscle weakness (generalized) (M62.81)    Time: 1610-9604 PT Time Calculation (min) (ACUTE ONLY): 22 min   Charges:   PT Evaluation $PT Eval Moderate Complexity: 1 Mod PT Treatments $Therapeutic Activity: 8-22 mins PT General Charges $$ ACUTE PT VISIT: 1 Visit         1:36 PM, 03/13/24 Walton Guppy, MPT Physical Therapist with [facility] [phone] office [phone] mobile phone

## 2024-03-13 NOTE — TOC Transition Note (Signed)
 Transition of Care [facility]) - Discharge Note   Patient Details  Name: Destiny Bennett MRN: [id number] Date of Birth: 06-29-1961  Transition of [facility]) CM/SW Contact:  Orelia Binet, RN Phone Number: 03/13/2024, 11:49 AM   Clinical Narrative:   CM at the bedside to confirm that patient has a back brace and discuss outpatient PT.  Patient has called to make an appointment with neurosurgeon to discuss kyphoplasty. She will wait on outpatient PT for now, until she discuss options that that appointment.     Final next level of care: Home/Self Care Barriers to Discharge: Barriers Resolved   Patient Goals and CMS Choice Patient states their goals for this hospitalization and ongoing recovery are:: get better CMS Medicare.gov Compare Post Acute Care list provided to:: Patient Choice offered to / list presented to : Patient      Discharge Placement                    Patient and family notified of of transfer: 03/13/24  Discharge Plan and Services Additional resources added to the After Visit Summary for     Discharge Planning Services: CM Consult            DME Arranged:  (TLSO) DME Agency: Other - Comment (BIO - TECh) Date DME Agency Contacted: 03/13/24 Time DME Agency Contacted: [phone] Representative spoke with at DME Agency: Called IN            Social Drivers of Health (SDOH) Interventions SDOH Screenings   Food Insecurity: No Food Insecurity (03/13/2024)  Housing: Low Risk  (03/13/2024)  Transportation Needs: No Transportation Needs (03/13/2024)  Utilities: Not At Risk (03/13/2024)  Tobacco Use: Unknown (03/12/2024)     Readmission Risk Interventions     No data to display

## 2024-03-13 NOTE — Plan of Care (Signed)
   Problem: Activity: Goal: Risk for activity intolerance will decrease Outcome: Progressing   Problem: Coping: Goal: Level of anxiety will decrease Outcome: Progressing   Problem: Safety: Goal: Ability to remain free from injury will improve Outcome: Progressing

## 2024-03-13 NOTE — Discharge Summary (Signed)
 Physician Discharge Summary  Destiny Bennett UYQ:[id number] DOB: 07-Dec-1960 DOA: 03/12/2024  PCP: Caresse Chant, FNP  Admit date: 03/12/2024  Discharge date: 03/13/2024  Admitted From:Home  Disposition:  Home  Recommendations for Outpatient Follow-up:  Follow up with PCP in 1-2 weeks, follow-up BMP in 1 week Counseled on fluid restriction at home Follow-up with neurosurgery Dr. Michale Age which will be scheduled outpatient for consideration of kyphoplasty Continue to wear home TLSO brace Pain medications with oxycodone  as well as muscle relaxers prescribed Continue other home medications as prior  Home Health: None  Equipment/Devices: None  Discharge Condition:Stable  CODE STATUS: Full  Diet recommendation: Heart Healthy, 1500 mL fluid restriction  Brief/Interim Summary: Destiny Bennett is an 63 y.o. female with a PMH of hypertension, hypothyroidism, OCD and an L5 compression fracture status postsurgical repair who suffered from a mechanical fall when attempting to get into a truck yesterday.  The floorboard that she stepped onto gave way and she fell backwards landing on her tailbone.  Patient was noted to have closed compression fracture of L5 lumbar vertebra and was admitted for aggressive pain management, but is now stable for discharge and has ambulated with physical therapy with no concerns noted.  She was additionally noted to have some hyponatremia and TSH is within normal limits.  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  She will not be transition to oral medications and has ambulated with PT with no concerns noted and has already spoken with her neurosurgery team regarding outpatient follow-up and has home TLSO brace.  No other acute events or concerns noted throughout the course of this brief admission.  Blood pressures have now stabilized.  Discharge Diagnoses:  Principal Problem:   Closed  compression fracture of body of L1 vertebra (HCC) Active Problems:   Sacral back pain   Hyponatremia   Glucosuria with normal serum glucose   Elevated blood pressure reading with diagnosis of hypertension  Principal discharge diagnosis: Closed compression fracture of L5 lumbar vertebra status post recent mechanical fall.  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  Discharge Instructions  Discharge Instructions     Diet - low sodium heart healthy   Complete by: As directed    Increase activity slowly   Complete by: As directed       Allergies as of 03/13/2024       Reactions   Somatropin Other (See Comments)        Medication List     TAKE these medications    citalopram  40 MG tablet Commonly known as: CELEXA  Take 40 mg by mouth daily.   hydrochlorothiazide  12.5 MG capsule Commonly known as: MICROZIDE  Take 12.5 mg by mouth daily.   ketorolac 10 MG tablet Commonly known as: TORADOL Take 10 mg by mouth every 6 (six) hours as needed.   levothyroxine  100 MCG tablet Commonly known as: SYNTHROID  Take 100 mcg by mouth daily before breakfast.   oxyCODONE  5 MG immediate release tablet Commonly known as: Oxy IR/ROXICODONE  Take 1 tablet (5 mg total) by mouth every 4 (four) hours as needed for moderate pain (pain score 4-6), severe pain (pain score 7-10) or breakthrough pain.   SUMAtriptan  50 MG tablet Commonly known as: IMITREX  Take 50 mg by mouth every 2 (two) hours as needed for migraine. May repeat in 2 hours if headache persists or recurs.   temazepam 30 MG capsule Commonly known as: RESTORIL Take 30 mg  by mouth at bedtime.   tiZANidine  4 MG tablet Commonly known as: ZANAFLEX  Take 1 tablet (4 mg total) by mouth every 6 (six) hours as needed for muscle spasms.        Follow-up Information     Caresse Chant, FNP. Schedule an appointment as soon as possible for a visit in 1 week(s).   Specialty: Family Medicine Contact information: [address] [place]  [place] [address] [phone]         Audie Bleacher, MD. Go to.   Specialty: Neurosurgery Contact information: [address] [place] [address] [phone]                Allergies  Allergen Reactions   Somatropin Other (See Comments)    Consultations: None   Procedures/Studies: DG Lumbar Spine Complete Result Date: 03/12/2024 CLINICAL DATA:  Fall. EXAM: LUMBAR SPINE - COMPLETE 4+ VIEW; SACRUM AND COCCYX - 2+ VIEW COMPARISON:  Lumbar spine radiographs dated 10/21/2022. FINDINGS: Diffuse osseous demineralization. Transitional lumbosacral anatomy is again noted with sacralization of L6. Posterior fusion T11-L3 with pedicle screws at T11, T12, L2, and L3. Hardware appears intact with stable alignment. No significant periprosthetic lucency identified. Redemonstrated severe remote compression fracture of L1, not significantly changed compared to the prior exam. Age indeterminate wedge deformity of the L5 vertebral body with approximately 10-15% height loss. No evidence of osseous retropulsion. Similar grade 1 retrolisthesis of L3 on L4. Multilevel degenerative changes with facet arthropathy of the mid to lower lumbar spine. Sacroiliac joints and pubic symphysis are intact and anatomically aligned. Femoral heads are seated within the acetabula. IMPRESSION: 1. Age indeterminate wedge deformity of the L5 vertebral body with approximately 10-15% height loss, new since the prior exam dated 10/21/2022. No evidence of osseous retropulsion. 2. Redemonstrated severe remote compression fracture of L1, not significantly changed compared to the prior exam. 3. Posterior fusion T11-L3 with pedicle screws at T11, T12, L2, and L3. Hardware appears intact with stable alignment. 4. Multilevel degenerative changes with facet arthropathy of the mid to lower lumbar spine. Electronically Signed   By: Mannie Seek M.D.   On: 03/12/2024 13:04   DG Sacrum/Coccyx Result Date:  03/12/2024 CLINICAL DATA:  Fall. EXAM: LUMBAR SPINE - COMPLETE 4+ VIEW; SACRUM AND COCCYX - 2+ VIEW COMPARISON:  Lumbar spine radiographs dated 10/21/2022. FINDINGS: Diffuse osseous demineralization. Transitional lumbosacral anatomy is again noted with sacralization of L6. Posterior fusion T11-L3 with pedicle screws at T11, T12, L2, and L3. Hardware appears intact with stable alignment. No significant periprosthetic lucency identified. Redemonstrated severe remote compression fracture of L1, not significantly changed compared to the prior exam. Age indeterminate wedge deformity of the L5 vertebral body with approximately 10-15% height loss. No evidence of osseous retropulsion. Similar grade 1 retrolisthesis of L3 on L4. Multilevel degenerative changes with facet arthropathy of the mid to lower lumbar spine. Sacroiliac joints and pubic symphysis are intact and anatomically aligned. Femoral heads are seated within the acetabula. IMPRESSION: 1. Age indeterminate wedge deformity of the L5 vertebral body with approximately 10-15% height loss, new since the prior exam dated 10/21/2022. No evidence of osseous retropulsion. 2. Redemonstrated severe remote compression fracture of L1, not significantly changed compared to the prior exam. 3. Posterior fusion T11-L3 with pedicle screws at T11, T12, L2, and L3. Hardware appears intact with stable alignment. 4. Multilevel degenerative changes with facet arthropathy of the mid to lower lumbar spine. Electronically Signed   By: Mannie Seek M.D.   On: 03/12/2024  13:04     Discharge Exam: Vitals:   03/13/24 0422 03/13/24 0735  BP: (!) 193/85 (!) 152/70  Pulse: 67 68  Resp: 18 18  Temp: 97.9 F (36.6 C) 98.6 F (37 C)  SpO2: 90% 95%   Vitals:   03/13/24 0022 03/13/24 0037 03/13/24 0422 03/13/24 0735  BP: (!) 189/86  (!) 193/85 (!) 152/70  Pulse: 74  67 68  Resp: 18  18 18   Temp: 98.2 F (36.8 C)  97.9 F (36.6 C) 98.6 F (37 C)  TempSrc: Oral  Oral Oral   SpO2: 90% 90% 90% 95%  Weight: 68.9 kg     Height:        General: Pt is alert, awake, not in acute distress Cardiovascular: RRR, S1/S2 +, no rubs, no gallops Respiratory: CTA bilaterally, no wheezing, no rhonchi Abdominal: Soft, NT, ND, bowel sounds + Extremities: no edema, no cyanosis    The results of significant diagnostics from this hospitalization (including imaging, microbiology, ancillary and laboratory) are listed below for reference.     Microbiology: No results found for this or any previous visit (from the past 240 hours).   Labs: BNP (last 3 results) No results for input(s): BNP in the last 8760 hours. Basic Metabolic Panel: Recent Labs  Lab 03/12/24 1637 03/13/24 0432  NA 126* 130*  K 3.3* 3.2*  CL 87* 93*  CO2 27 26  GLUCOSE 113* 144*  BUN 11 11  CREATININE 0.96 1.01*  CALCIUM 9.3 9.1   Liver Function Tests: No results for input(s): AST, ALT, ALKPHOS, BILITOT, PROT, ALBUMIN in the last 168 hours. No results for input(s): LIPASE, AMYLASE in the last 168 hours. No results for input(s): AMMONIA in the last 168 hours. CBC: Recent Labs  Lab 03/12/24 1954  WBC 12.3*  HGB 14.2  HCT 38.4  MCV 88.7  PLT 179   Cardiac Enzymes: No results for input(s): CKTOTAL, CKMB, CKMBINDEX, TROPONINI in the last 168 hours. BNP: Invalid input(s): POCBNP CBG: No results for input(s): GLUCAP in the last 168 hours. D-Dimer No results for input(s): DDIMER in the last 72 hours. Hgb A1c Recent Labs    03/12/24 1954  HGBA1C 4.6*   Lipid Profile No results for input(s): CHOL, HDL, LDLCALC, TRIG, CHOLHDL, LDLDIRECT in the last 72 hours. Thyroid  function studies Recent Labs    03/12/24 1637  TSH 1.135   Anemia work up No results for input(s): VITAMINB12, FOLATE, FERRITIN, TIBC, IRON, RETICCTPCT in the last 72 hours. Urinalysis    Component Value Date/Time   COLORURINE STRAW (A) 03/12/2024 1752    APPEARANCEUR CLEAR 03/12/2024 1752   APPEARANCEUR Clear 02/10/2022 1159   LABSPEC 1.004 (L) 03/12/2024 1752   PHURINE 6.0 03/12/2024 1752   GLUCOSEU 50 (A) 03/12/2024 1752   HGBUR NEGATIVE 03/12/2024 1752   BILIRUBINUR NEGATIVE 03/12/2024 1752   BILIRUBINUR Negative 02/10/2022 1159   KETONESUR NEGATIVE 03/12/2024 1752   PROTEINUR NEGATIVE 03/12/2024 1752   NITRITE NEGATIVE 03/12/2024 1752   LEUKOCYTESUR NEGATIVE 03/12/2024 1752   Sepsis Labs Recent Labs  Lab 03/12/24 1954  WBC 12.3*   Microbiology No results found for this or any previous visit (from the past 240 hours).   Time coordinating discharge: 35 minutes  SIGNED:   Cornelius Dill, DO [facility] 03/13/2024, 10:28 AM  If 7PM-7AM, please contact night-coverage [email or web address]

## 2024-03-13 NOTE — Progress Notes (Signed)
 [facility] Discharge Note  Admit Date:  03/12/2024 Discharge date: 03/13/2024   Arianah Torgeson to be D/C'd Home per MD order.  AVS completed. Patient/caregiver able to verbalize understanding.  Discharge Medication: Allergies as of 03/13/2024       Reactions   Somatropin Other (See Comments)        Medication List     TAKE these medications    citalopram  40 MG tablet Commonly known as: CELEXA  Take 40 mg by mouth daily.   hydrochlorothiazide  12.5 MG capsule Commonly known as: MICROZIDE  Take 12.5 mg by mouth daily.   ketorolac 10 MG tablet Commonly known as: TORADOL Take 10 mg by mouth every 6 (six) hours as needed.   levothyroxine  100 MCG tablet Commonly known as: SYNTHROID  Take 100 mcg by mouth daily before breakfast.   oxyCODONE  5 MG immediate release tablet Commonly known as: Oxy IR/ROXICODONE  Take 1 tablet (5 mg total) by mouth every 4 (four) hours as needed for moderate pain (pain score 4-6), severe pain (pain score 7-10) or breakthrough pain.   SUMAtriptan  50 MG tablet Commonly known as: IMITREX  Take 50 mg by mouth every 2 (two) hours as needed for migraine. May repeat in 2 hours if headache persists or recurs.   temazepam 30 MG capsule Commonly known as: RESTORIL Take 30 mg by mouth at bedtime.   tiZANidine  4 MG tablet Commonly known as: ZANAFLEX  Take 1 tablet (4 mg total) by mouth every 6 (six) hours as needed for muscle spasms.        Discharge Assessment: Vitals:   03/13/24 0422 03/13/24 0735  BP: (!) 193/85 (!) 152/70  Pulse: 67 68  Resp: 18 18  Temp: 97.9 F (36.6 C) 98.6 F (37 C)  SpO2: 90% 95%   Skin clean, dry and intact without evidence of skin break down, no evidence of skin tears noted. IV catheter discontinued intact. Site without signs and symptoms of complications - no redness or edema noted at insertion site, patient denies c/o pain - only slight tenderness at site.  Dressing with slight pressure applied.  D/c  Instructions-Education: Discharge instructions given to patient/family with verbalized understanding. D/c education completed with patient/family including follow up instructions, medication list, d/c activities limitations if indicated, with other d/c instructions as indicated by MD - patient able to verbalize understanding, all questions fully answered. Patient instructed to return to ED, call 911, or call MD for any changes in condition.  Patient escorted via WC, and D/C home via private auto.  Stacie Dys, LPN 02/29/5408 81:19 AM

## 2024-03-16 ENCOUNTER — Ambulatory Visit (HOSPITAL_COMMUNITY)
Admission: RE | Admit: 2024-03-16 | Discharge: 2024-03-17 | Disposition: A | Discharge status: Home / Self Care | Source: Ambulatory Visit | Attending: Neurosurgery | Admitting: Neurosurgery

## 2024-03-16 ENCOUNTER — Other Ambulatory Visit: Payer: Self-pay

## 2024-03-16 ENCOUNTER — Ambulatory Visit (HOSPITAL_COMMUNITY): Admitting: Anesthesiology

## 2024-03-16 ENCOUNTER — Ambulatory Visit (HOSPITAL_COMMUNITY)

## 2024-03-16 ENCOUNTER — Encounter (HOSPITAL_COMMUNITY): Payer: Self-pay | Admitting: Neurosurgery

## 2024-03-16 ENCOUNTER — Other Ambulatory Visit: Payer: Self-pay | Admitting: Neurosurgery

## 2024-03-16 ENCOUNTER — Encounter (HOSPITAL_COMMUNITY)
Admission: RE | Disposition: A | Discharge status: Home / Self Care | Payer: Self-pay | Source: Ambulatory Visit | Attending: Neurosurgery

## 2024-03-16 DIAGNOSIS — S32050A Wedge compression fracture of fifth lumbar vertebra, initial encounter for closed fracture: Secondary | ICD-10-CM | POA: Diagnosis present

## 2024-03-16 DIAGNOSIS — I10 Essential (primary) hypertension: Secondary | ICD-10-CM | POA: Insufficient documentation

## 2024-03-16 DIAGNOSIS — W1789XA Other fall from one level to another, initial encounter: Secondary | ICD-10-CM | POA: Diagnosis not present

## 2024-03-16 DIAGNOSIS — E039 Hypothyroidism, unspecified: Secondary | ICD-10-CM | POA: Insufficient documentation

## 2024-03-16 HISTORY — PX: KYPHOPLASTY: SHX5884

## 2024-03-16 LAB — POCT I-STAT, CHEM 8
BUN: 13 mg/dL (ref 8–23)
Calcium, Ion: 1.11 mmol/L — ABNORMAL LOW (ref 1.15–1.40)
Chloride: 95 mmol/L — ABNORMAL LOW (ref 98–111)
Creatinine, Ser: 1.3 mg/dL — ABNORMAL HIGH (ref 0.44–1.00)
Glucose, Bld: 94 mg/dL (ref 70–99)
HCT: 41 % (ref 36.0–46.0)
Hemoglobin: 13.9 g/dL (ref 12.0–15.0)
Potassium: 3.4 mmol/L — ABNORMAL LOW (ref 3.5–5.1)
Sodium: 134 mmol/L — ABNORMAL LOW (ref 135–145)
TCO2: 28 mmol/L (ref 22–32)

## 2024-03-16 SURGERY — KYPHOPLASTY
Anesthesia: General | Site: Back

## 2024-03-16 MED ORDER — ROCURONIUM BROMIDE 10 MG/ML (PF) SYRINGE
PREFILLED_SYRINGE | INTRAVENOUS | Status: AC
  Filled 2024-03-16: qty 10

## 2024-03-16 MED ORDER — PROPOFOL 10 MG/ML IV BOLUS
INTRAVENOUS | Status: DC | PRN
  Administered 2024-03-16: 50 mg via INTRAVENOUS
  Administered 2024-03-16: 150 mg via INTRAVENOUS

## 2024-03-16 MED ORDER — HYDRALAZINE HCL 20 MG/ML IJ SOLN
INTRAMUSCULAR | Status: AC
  Administered 2024-03-16: 10 mg via INTRAVENOUS
  Filled 2024-03-16: qty 1

## 2024-03-16 MED ORDER — DIAZEPAM 5 MG PO TABS
5.0000 mg | ORAL_TABLET | Freq: Four times a day (QID) | ORAL | Status: DC | PRN
  Administered 2024-03-16: 5 mg via ORAL
  Filled 2024-03-16: qty 1

## 2024-03-16 MED ORDER — LIDOCAINE 2% (20 MG/ML) 5 ML SYRINGE
INTRAMUSCULAR | Status: AC
  Filled 2024-03-16: qty 5

## 2024-03-16 MED ORDER — LABETALOL HCL 5 MG/ML IV SOLN
10.0000 mg | INTRAVENOUS | Status: DC | PRN
  Administered 2024-03-16: 10 mg via INTRAVENOUS

## 2024-03-16 MED ORDER — HYDROCHLOROTHIAZIDE 25 MG PO TABS
12.5000 mg | ORAL_TABLET | Freq: Every day | ORAL | Status: DC
  Administered 2024-03-16 – 2024-03-17 (×2): 12.5 mg via ORAL
  Filled 2024-03-16 (×3): qty 1

## 2024-03-16 MED ORDER — LIDOCAINE 2% (20 MG/ML) 5 ML SYRINGE
INTRAMUSCULAR | Status: DC | PRN
  Administered 2024-03-16: 60 mg via INTRAVENOUS

## 2024-03-16 MED ORDER — LABETALOL HCL 5 MG/ML IV SOLN
10.0000 mg | Freq: Once | INTRAVENOUS | Status: AC
  Administered 2024-03-16: 10 mg via INTRAVENOUS

## 2024-03-16 MED ORDER — FENTANYL CITRATE (PF) 250 MCG/5ML IJ SOLN
INTRAMUSCULAR | Status: AC
  Filled 2024-03-16: qty 5

## 2024-03-16 MED ORDER — LABETALOL HCL 5 MG/ML IV SOLN
INTRAVENOUS | Status: AC
  Filled 2024-03-16: qty 4

## 2024-03-16 MED ORDER — DEXAMETHASONE SODIUM PHOSPHATE 10 MG/ML IJ SOLN
INTRAMUSCULAR | Status: DC | PRN
  Administered 2024-03-16: 10 mg via INTRAVENOUS

## 2024-03-16 MED ORDER — CITALOPRAM HYDROBROMIDE 20 MG PO TABS
40.0000 mg | ORAL_TABLET | Freq: Every day | ORAL | Status: DC
  Administered 2024-03-16 – 2024-03-17 (×2): 40 mg via ORAL
  Filled 2024-03-16 (×2): qty 2

## 2024-03-16 MED ORDER — ROCURONIUM BROMIDE 10 MG/ML (PF) SYRINGE
PREFILLED_SYRINGE | INTRAVENOUS | Status: DC | PRN
  Administered 2024-03-16: 50 mg via INTRAVENOUS

## 2024-03-16 MED ORDER — SUMATRIPTAN SUCCINATE 50 MG PO TABS: 50.0000 mg | ORAL_TABLET | ORAL | Status: DC | PRN

## 2024-03-16 MED ORDER — MIDAZOLAM HCL 2 MG/2ML IJ SOLN
INTRAMUSCULAR | Status: DC | PRN
  Administered 2024-03-16: 2 mg via INTRAVENOUS

## 2024-03-16 MED ORDER — HYDRALAZINE HCL 20 MG/ML IJ SOLN: 10.0000 mg | Freq: Once | INTRAMUSCULAR | Status: AC

## 2024-03-16 MED ORDER — DEXAMETHASONE SODIUM PHOSPHATE 10 MG/ML IJ SOLN
INTRAMUSCULAR | Status: AC
  Filled 2024-03-16: qty 1

## 2024-03-16 MED ORDER — HYDROMORPHONE HCL 1 MG/ML IJ SOLN
0.2500 mg | INTRAMUSCULAR | Status: DC | PRN
  Administered 2024-03-16 (×3): 0.5 mg via INTRAVENOUS

## 2024-03-16 MED ORDER — CHLORHEXIDINE GLUCONATE 0.12 % MT SOLN
15.0000 mL | Freq: Once | OROMUCOSAL | Status: AC
  Administered 2024-03-16: 15 mL via OROMUCOSAL
  Filled 2024-03-16: qty 15

## 2024-03-16 MED ORDER — ORAL CARE MOUTH RINSE: 15.0000 mL | Freq: Once | OROMUCOSAL | Status: AC

## 2024-03-16 MED ORDER — LIDOCAINE-EPINEPHRINE 0.5 %-1:200000 IJ SOLN
INTRAMUSCULAR | Status: AC
  Filled 2024-03-16: qty 50

## 2024-03-16 MED ORDER — LACTATED RINGERS IV SOLN: INTRAVENOUS | Status: DC

## 2024-03-16 MED ORDER — SUGAMMADEX SODIUM 200 MG/2ML IV SOLN
INTRAVENOUS | Status: DC | PRN
  Administered 2024-03-16: 200 mg via INTRAVENOUS

## 2024-03-16 MED ORDER — MIDAZOLAM HCL 2 MG/2ML IJ SOLN
INTRAMUSCULAR | Status: AC
  Filled 2024-03-16: qty 2

## 2024-03-16 MED ORDER — FENTANYL CITRATE (PF) 100 MCG/2ML IJ SOLN
INTRAMUSCULAR | Status: AC
  Administered 2024-03-16: 50 ug via INTRAVENOUS
  Filled 2024-03-16: qty 2

## 2024-03-16 MED ORDER — FENTANYL CITRATE (PF) 250 MCG/5ML IJ SOLN
INTRAMUSCULAR | Status: DC | PRN
  Administered 2024-03-16 (×2): 100 ug via INTRAVENOUS

## 2024-03-16 MED ORDER — CEFAZOLIN SODIUM-DEXTROSE 1-4 GM/50ML-% IV SOLN
INTRAVENOUS | Status: DC | PRN
  Administered 2024-03-16: 2 g via INTRAVENOUS

## 2024-03-16 MED ORDER — TEMAZEPAM 15 MG PO CAPS: 30.0000 mg | ORAL_CAPSULE | Freq: Every day | ORAL | Status: DC

## 2024-03-16 MED ORDER — ONDANSETRON HCL 4 MG/2ML IJ SOLN
INTRAMUSCULAR | Status: DC | PRN
  Administered 2024-03-16: 4 mg via INTRAVENOUS

## 2024-03-16 MED ORDER — PROPOFOL 10 MG/ML IV BOLUS
INTRAVENOUS | Status: AC
  Filled 2024-03-16: qty 20

## 2024-03-16 MED ORDER — 0.9 % SODIUM CHLORIDE (POUR BTL) OPTIME
TOPICAL | Status: DC | PRN
Start: 2024-03-16 — End: 2024-03-16
  Administered 2024-03-16: 1000 mL

## 2024-03-16 MED ORDER — IOPAMIDOL (ISOVUE-300) INJECTION 61%
INTRAVENOUS | Status: DC | PRN
  Administered 2024-03-16: 100 mL

## 2024-03-16 MED ORDER — PHENYLEPHRINE HCL-NACL 20-0.9 MG/250ML-% IV SOLN
INTRAVENOUS | Status: DC | PRN
  Administered 2024-03-16: 80 ug/min via INTRAVENOUS

## 2024-03-16 MED ORDER — LIDOCAINE-EPINEPHRINE 0.5 %-1:200000 IJ SOLN
INTRAMUSCULAR | Status: DC | PRN
  Administered 2024-03-16: 7 mL

## 2024-03-16 MED ORDER — PHENYLEPHRINE 80 MCG/ML (10ML) SYRINGE FOR IV PUSH (FOR BLOOD PRESSURE SUPPORT)
PREFILLED_SYRINGE | INTRAVENOUS | Status: DC | PRN
  Administered 2024-03-16 (×2): 160 ug via INTRAVENOUS
  Administered 2024-03-16: 80 ug via INTRAVENOUS

## 2024-03-16 MED ORDER — HYDROMORPHONE HCL 1 MG/ML IJ SOLN
INTRAMUSCULAR | Status: AC
  Filled 2024-03-16: qty 1

## 2024-03-16 MED ORDER — POTASSIUM CHLORIDE IN NACL 20-0.9 MEQ/L-% IV SOLN
INTRAVENOUS | Status: DC
  Filled 2024-03-16 (×2): qty 1000

## 2024-03-16 MED ORDER — PHENYLEPHRINE 80 MCG/ML (10ML) SYRINGE FOR IV PUSH (FOR BLOOD PRESSURE SUPPORT)
PREFILLED_SYRINGE | INTRAVENOUS | Status: AC
  Filled 2024-03-16: qty 10

## 2024-03-16 MED ORDER — FENTANYL CITRATE (PF) 100 MCG/2ML IJ SOLN: 50.0000 ug | Freq: Once | INTRAMUSCULAR | Status: AC

## 2024-03-16 MED ORDER — OXYCODONE HCL 5 MG PO TABS
5.0000 mg | ORAL_TABLET | ORAL | Status: DC | PRN
  Administered 2024-03-16 – 2024-03-17 (×2): 5 mg via ORAL
  Filled 2024-03-16 (×3): qty 1

## 2024-03-16 MED ORDER — LEVOTHYROXINE SODIUM 100 MCG PO TABS
100.0000 ug | ORAL_TABLET | Freq: Every day | ORAL | Status: DC
  Administered 2024-03-17: 100 ug via ORAL
  Filled 2024-03-16: qty 1

## 2024-03-16 MED ORDER — ONDANSETRON HCL 4 MG/2ML IJ SOLN
INTRAMUSCULAR | Status: AC
  Filled 2024-03-16: qty 2

## 2024-03-16 SURGICAL SUPPLY — 29 items
BAG COUNTER SPONGE SURGICOUNT (BAG) ×1 IMPLANT
BLADE CLIPPER SURG (BLADE) IMPLANT
BLADE SURG 15 STRL LF DISP TIS (BLADE) ×1 IMPLANT
CEMENT KYPHON C01A KIT/MIXER (Cement) IMPLANT
CNTNR URN SCR LID CUP LEK RST (MISCELLANEOUS) ×1 IMPLANT
DERMABOND ADVANCED .7 DNX12 (GAUZE/BANDAGES/DRESSINGS) ×1 IMPLANT
DRAPE C-ARM 42X72 X-RAY (DRAPES) ×2 IMPLANT
DRAPE LAPAROTOMY 100X72X124 (DRAPES) ×1 IMPLANT
DRAPE WARM FLUID 44X44 (DRAPES) ×1 IMPLANT
DURAPREP 26ML APPLICATOR (WOUND CARE) ×1 IMPLANT
GAUZE 4X4 16PLY ~~LOC~~+RFID DBL (SPONGE) ×1 IMPLANT
GLOVE ECLIPSE 6.5 STRL STRAW (GLOVE) ×1 IMPLANT
GOWN STRL REUS W/ TWL LRG LVL3 (GOWN DISPOSABLE) ×1 IMPLANT
KIT BASIN OR (CUSTOM PROCEDURE TRAY) ×1 IMPLANT
KIT POSITION SURG JACKSON T1 (MISCELLANEOUS) ×1 IMPLANT
KIT TURNOVER KIT B (KITS) ×1 IMPLANT
NDL HYPO 25X1 1.5 SAFETY (NEEDLE) ×1 IMPLANT
NEEDLE HYPO 25X1 1.5 SAFETY (NEEDLE) ×1 IMPLANT
NS IRRIG 1000ML POUR BTL (IV SOLUTION) ×1 IMPLANT
PACK SRG BSC III STRL LF ECLPS (CUSTOM PROCEDURE TRAY) ×1 IMPLANT
PAD ARMBOARD POSITIONER FOAM (MISCELLANEOUS) ×3 IMPLANT
SPECIMEN JAR SMALL (MISCELLANEOUS) IMPLANT
SPIKE FLUID TRANSFER (MISCELLANEOUS) ×1 IMPLANT
STAPLER SKIN PROX 35W (STAPLE) ×1 IMPLANT
SUT VIC AB 3-0 SH 8-18 (SUTURE) ×1 IMPLANT
SYR CONTROL 10ML LL (SYRINGE) ×2 IMPLANT
TOWEL GREEN STERILE (TOWEL DISPOSABLE) ×1 IMPLANT
TOWEL GREEN STERILE FF (TOWEL DISPOSABLE) ×1 IMPLANT
TRAY KYPHOPAK 20/3 ONESTEP 1ST (MISCELLANEOUS) IMPLANT

## 2024-03-16 NOTE — Op Note (Signed)
 03/16/2024  3:09 PM  PATIENT:  Destiny Bennett  63 y.o. female  PRE-OPERATIVE DIAGNOSIS:  Compression fracture of Lumbar five vertebra, initial encounter  POST-OPERATIVE DIAGNOSIS:  Compression fracture of Lumbar five vertebra, initial encounter  PROCEDURE:  Procedure(s): LUMBAR FIVE KYPHOPLASTY  SURGEON:  Surgeon(s): Audie Bleacher, MD  ANESTHESIA:   general  EBL:  Total I/O In: 800 [I.V.:800] Out: -   BLOOD ADMINISTERED:none  COUNT:per nursing  SPECIMEN:  No Specimen  DICTATION: Destiny Bennett was taken to the operating room, intubated and placed under a general anesthetic without difficulty. Shewas positioned prone on the operating room table with all pressure points properly padded. Her back was prepped and draped in a sterile manner. With fluoroscopy I localized the L5 pedicles bilaterally. I injected lidocaine  into the entry sites on both the left and right sides. I started by making a stab incision on the left side and entering the left L5 pedicle with fluoroscopic guidance. Once good position was obtained, I drilled into the vertebral body. I then placed the kyphoplasty balloon into the L5 vertebra and inflated the balloon. I performed the same action on the right side also. I then inserted 7cc of methylmethacrylate into the vertebral body under fluoroscopic guidance. I achieved a very good fill of the cavity, staying within the confines of the vertebral body. I removed the instrumentation from the vertebral body, and the final films looked good. I closed the stab incision with vicryl suture and used Dermabond for a sterile dressing. Aaron Aas    PLAN OF CARE: Admit to inpatient   PATIENT DISPOSITION:  PACU - hemodynamically stable.   Delay start of Pharmacological VTE agent (>24hrs) due to surgical blood loss or risk of bleeding:  yes

## 2024-03-16 NOTE — H&P (Signed)
 Destiny Bennett

## 2024-03-16 NOTE — Anesthesia Procedure Notes (Signed)
 Procedure Name: Intubation Date/Time: 03/16/2024 1:52 PM  Performed by: Artemisa Bile D, CRNAPre-anesthesia Checklist: Patient identified, Emergency Drugs available, Suction available and Patient being monitored Patient Re-evaluated:Patient Re-evaluated prior to induction Oxygen Delivery Method: Circle System Utilized Preoxygenation: Pre-oxygenation with 100% oxygen Induction Type: IV induction Ventilation: Mask ventilation without difficulty Laryngoscope Size: 3 and Mac Grade View: Grade I Tube type: Oral Tube size: 7.0 mm Number of attempts: 1 Airway Equipment and Method: Stylet and Oral airway Placement Confirmation: ETT inserted through vocal cords under direct vision, positive ETCO2 and breath sounds checked- equal and bilateral Secured at: 21 cm Tube secured with: Tape Dental Injury: Teeth and Oropharynx as per pre-operative assessment

## 2024-03-16 NOTE — Transfer of Care (Signed)
 Immediate Anesthesia Transfer of Care Note  Patient: Destiny Bennett  Procedure(s) Performed: LUMBAR FIVE KYPHOPLASTY (Back)  Patient Location: PACU  Anesthesia Type:General  Level of Consciousness: awake, alert , and oriented  Airway & Oxygen Therapy: Patient Spontanous Breathing  Post-op Assessment: Report given to RN and Post -op Vital signs reviewed and stable  Post vital signs: Reviewed and stable  Last Vitals:  Vitals Value Taken Time  BP 216/118 03/16/24 14:53  Temp 36.8 C 03/16/24 14:51  Pulse 99 03/16/24 14:53  Resp 18 03/16/24 14:53  SpO2 97 % 03/16/24 14:53  Vitals shown include unfiled device data.  Last Pain:  Vitals:   03/16/24 1257  TempSrc:   PainSc: 7          Complications: No notable events documented.

## 2024-03-16 NOTE — Interval H&P Note (Signed)
 History and Physical Interval Note:  03/16/2024 1:36 PM  Destiny Bennett  has presented today for surgery, with the diagnosis of Compression fracture of L5 vertebra, initial encounter.  The various methods of treatment have been discussed with the patient and family. After consideration of risks, benefits and other options for treatment, the patient has consented to  Procedure(s) with comments: KYPHOPLASTY (N/A) - Kyphoplasty - L5 as a surgical intervention.  The patient's history has been reviewed, patient examined, no change in status, stable for surgery.  I have reviewed the patient's chart and labs.  Questions were answered to the patient's satisfaction.   On examination she is alert, oriented x 4, speech is clear and fluent. She is moving all extremities well.  5/5 strength in the upper and lower extremities. Reflexes 2+ in upper and lower extremities.  Antalgic gait. Perrl, full eom. Tongue and uvula in the midline. Hearing intact to voice.   Hanad Leino

## 2024-03-16 NOTE — Anesthesia Preprocedure Evaluation (Addendum)
 Anesthesia Evaluation  Patient identified by MRN, date of birth, ID band Patient awake    Reviewed: Allergy & Precautions, NPO status , Patient's Chart, lab work & pertinent test results  History of Anesthesia Complications Negative for: history of anesthetic complications  Airway Mallampati: II  TM Distance: >3 FB Neck ROM: Full    Dental  (+) Dental Advisory Given, Teeth Intact   Pulmonary neg pulmonary ROS   Pulmonary exam normal        Cardiovascular hypertension, Pt. on medications Normal cardiovascular exam     Neuro/Psych  Headaches PSYCHIATRIC DISORDERS Anxiety      OCD    GI/Hepatic Neg liver ROS,,,  Endo/Other  Hypothyroidism   Na 130 K 3.2 Cl 93   Renal/GU negative Renal ROS     Musculoskeletal negative musculoskeletal ROS (+)    Abdominal   Peds  Hematology negative hematology ROS (+)   Anesthesia Other Findings   Reproductive/Obstetrics  s/p hysterectomy                              Anesthesia Physical Anesthesia Plan  ASA: 2  Anesthesia Plan: General   Post-op Pain Management: Tylenol  PO (pre-op)*   Induction: Intravenous  PONV Risk Score and Plan: 3 and Treatment may vary due to age or medical condition, Ondansetron , Dexamethasone  and Midazolam   Airway Management Planned: Oral ETT  Additional Equipment: None  Intra-op Plan:   Post-operative Plan: Extubation in OR  Informed Consent: I have reviewed the patients History and Physical, chart, labs and discussed the procedure including the risks, benefits and alternatives for the proposed anesthesia with the patient or authorized representative who has indicated his/her understanding and acceptance.     Dental advisory given  Plan Discussed with: CRNA and Anesthesiologist  Anesthesia Plan Comments:         Anesthesia Quick Evaluation

## 2024-03-16 NOTE — Plan of Care (Signed)
  Problem: Health Behavior/Discharge Planning: Goal: Ability to manage health-related needs will improve Outcome: Progressing   Problem: Pain Managment: Goal: General experience of comfort will improve and/or be controlled Outcome: Progressing   Problem: Safety: Goal: Ability to remain free from injury will improve Outcome: Progressing

## 2024-03-17 DIAGNOSIS — S32050A Wedge compression fracture of fifth lumbar vertebra, initial encounter for closed fracture: Secondary | ICD-10-CM | POA: Diagnosis not present

## 2024-03-17 MED ORDER — HYDRALAZINE HCL 20 MG/ML IJ SOLN
10.0000 mg | Freq: Once | INTRAMUSCULAR | Status: AC | PRN
Start: 2024-03-17 — End: 2024-03-17
  Administered 2024-03-17: 10 mg via INTRAVENOUS

## 2024-03-17 MED ORDER — LABETALOL HCL 5 MG/ML IV SOLN
10.0000 mg | Freq: Once | INTRAVENOUS | Status: AC
  Administered 2024-03-17: 10 mg via INTRAVENOUS
  Filled 2024-03-17: qty 4

## 2024-03-17 MED ORDER — HYDRALAZINE HCL 20 MG/ML IJ SOLN
10.0000 mg | Freq: Once | INTRAMUSCULAR | Status: AC
  Administered 2024-03-17: 10 mg via INTRAVENOUS
  Filled 2024-03-17: qty 1

## 2024-03-17 NOTE — Discharge Summary (Signed)
 Physician Discharge Summary     Providing Compassionate, Quality Care - Together   Patient ID: Destiny Bennett MRN: [id number] DOB/AGE: 61/09/1960 61 y.o.  Admit date: 03/16/2024 Discharge date: 03/17/2024  Admission Diagnoses: Closed wedge compression fracture of L5 vertebra  Discharge Diagnoses:  Principal Problem:   Closed wedge compression fracture of L5 vertebra [facility])   Discharged Condition: good  Hospital Course: Patient underwent an L5 kyphoplasty by Dr. Gillie on 03/16/2024. She was admitted to 3C10 following recovery from anesthesia in the PACU. Her postoperative course has been complicated by hypertension, which the patient was addressing with her PCP prior to surgery. She has a follow up appointment scheduled on Monday. She has worked with occupational therapy who feels the patient is ready for discharge home. She is ambulating independently and without difficulty. She is tolerating a normal diet. She is not having any bowel or bladder dysfunction. Her pain is well-controlled with oral pain medication. She is ready for discharge home. Nurse will recheck patient's blood pressure prior to discharge and notify attending if needed.   Consults: None  Significant Diagnostic Studies: radiology: DG Lumbar Spine 2-3 Views Result Date: 03/16/2024 CLINICAL DATA:  Kyphoplasty EXAM: LUMBAR SPINE - 2-3 VIEW COMPARISON:  Lumbar radiograph 03/12/2024 FINDINGS: Two intraoperative views of the lumbar spine demonstrate placement kyphoplasty cement within the L5 vertebral body. IMPRESSION: Kyphoplasty as above. Electronically Signed   By: Jackquline Boxer M.D.   On: 03/16/2024 18:40   DG C-Arm 1-60 Min-No Report Result Date: 03/16/2024 Fluoroscopy was utilized by the requesting physician.  No radiographic interpretation.     Treatments: surgery: L5 kyphoplasty  Discharge Exam: Blood pressure (!) 171/75, pulse 94, temperature 98.6 F (37 C), temperature source Oral, resp. rate 20, height (P)  5' 6 (1.676 m), weight (P) 68.9 kg, SpO2 99%.  Alert and oriented x 4 PERRLA CN II-XII grossly intact MAE, Strength and sensation intact Incision is covered with Honeycomb dressing; Dressing is clean, dry, and intact   Disposition: Discharge disposition: 01-Home or Self Care       Discharge Instructions     Call MD for:  difficulty breathing, headache or visual disturbances   Complete by: As directed    Call MD for:  persistant nausea and vomiting   Complete by: As directed    Call MD for:  redness, tenderness, or signs of infection (pain, swelling, redness, odor or green/yellow discharge around incision site)   Complete by: As directed    Call MD for:  severe uncontrolled pain   Complete by: As directed    Call MD for:  temperature >100.4   Complete by: As directed    Diet general   Complete by: As directed    Discharge instructions   Complete by: As directed    Care After These instructions give you information on caring for yourself after your procedure. Your doctor may also give you more specific instructions. Call your doctor if you have any problems or questions after your procedure. HOME CARE Take medicine as told by your doctor. Keep your wound covered for 24 hours or as told by your doctor. Ask your doctor when you can bathe or shower. Put ice on your wound if it helps your pain. Put ice in a plastic bag. Place a towel between your skin and the bag. Leave the ice on for 15 to 20 minutes, 3 to 4 times a day.  Return to normal activities as told by your doctor. Ask your doctor what stretches and  exercises you can do. Do not bend or lift anything heavy as told by your doctor. GET HELP RIGHT AWAY IF:  You have bad back pain that comes on suddenly. You cannot control when you pee (urinate) or poop (bowel movement). You lose feeling (numbness) in your legs or feet, or they become weak. You have shooting pain down your legs. You have a fever. Your wound becomes  red, puffy (swollen), or tender to the touch. You are bleeding or leaking fluid from the wound. You are sick to your stomach (nauseous) or throw up (vomit) for more than 24 hours after the procedure. Your back pain does not get better. MAKE SURE YOU: Understand these instructions. Will watch your condition. Will get help right away if you are not doing well or get worse. Document Released: 12/08/2009 Document Revised: 12/06/2011 Document Reviewed: 02/17/2011 [facility] Patient Information 2013 [place], [place].   Increase activity slowly   Complete by: As directed    Lifting restrictions   Complete by: As directed    No lifting greater than 5lbs      Allergies as of 03/17/2024       Reactions   Somatropin Other (See Comments)   Hydralazine  Other (See Comments)   Flushing of face/Feels hot        Medication List     TAKE these medications    citalopram  40 MG tablet Commonly known as: CELEXA  Take 40 mg by mouth daily.   hydrochlorothiazide  12.5 MG capsule Commonly known as: MICROZIDE  Take 12.5 mg by mouth daily.   ketorolac 10 MG tablet Commonly known as: TORADOL Take 10 mg by mouth every 6 (six) hours as needed for moderate pain (pain score 4-6).   levothyroxine  100 MCG tablet Commonly known as: SYNTHROID  Take 100 mcg by mouth daily before breakfast.   oxyCODONE  5 MG immediate release tablet Commonly known as: Oxy IR/ROXICODONE  Take 1 tablet (5 mg total) by mouth every 4 (four) hours as needed for moderate pain (pain score 4-6), severe pain (pain score 7-10) or breakthrough pain.   SUMAtriptan  50 MG tablet Commonly known as: IMITREX  Take 50 mg by mouth every 2 (two) hours as needed for migraine. May repeat in 2 hours if headache persists or recurs.   temazepam  30 MG capsule Commonly known as: RESTORIL  Take 30 mg by mouth at bedtime.   tiZANidine  4 MG tablet Commonly known as: ZANAFLEX  Take 1 tablet (4 mg total) by mouth every 6 (six) hours as needed for  muscle spasms.        Follow-up Information     Gillie Duncans, MD Follow up.   Specialty: Neurosurgery Why: keep your scheduled appointment Contact information: [address] [place] [address] [phone]                 Signed: Gerard Beck, DNP, AGNP-C Nurse Practitioner  [facility] [address], [place], [place] [address] P: [phone]    F: [phone]  03/17/2024, 9:30 AM

## 2024-03-17 NOTE — Evaluation (Signed)
 Occupational Therapy Evaluation Patient Details Name: Destiny Bennett MRN: [id number] DOB: 03/03/1961 Today's Date: 03/17/2024   History of Present Illness   Destiny Bennett is a 63y.o. female admitted for compression fracture after a mechanical fall when attempting to get into a truck s/p L5 kyphoplasty 03/16/2024. PMH includes HTN, hypothyroidism, OCD, L shoulder Arthroscopy.     Clinical Impressions PTA, pt reports she was independent with ADL/IADL and functional mobility. Pt reports no other hx of falls. Pt demonstrates ability to complete ADL/functional mobility independently without AD. She demonstrates good adherence to precautions. Patient evaluated by Occupational Therapy with no further acute OT needs identified. All education has been completed and the patient has no further questions. See below for any follow-up Occupational Therapy or equipment needs. OT to sign off. Thank you for referral.       If plan is discharge home, recommend the following:   Assistance with cooking/housework     Functional Status Assessment   Patient has had a recent decline in their functional status and demonstrates the ability to make significant improvements in function in a reasonable and predictable amount of time.     Equipment Recommendations   None recommended by OT     Recommendations for Other Services         Precautions/Restrictions   Precautions Precautions: None Spinal Brace:  (no brace required per verbal conversation with Destiny Beck, NP.) Restrictions Weight Bearing Restrictions Per Provider Order: No     Mobility Bed Mobility Overal bed mobility: Independent                  Transfers Overall transfer level: Independent                        Balance Overall balance assessment: No apparent balance deficits (not formally assessed)                                         ADL either performed or assessed with clinical  judgement   ADL Overall ADL's : Independent                                             Vision         Perception         Praxis         Pertinent Vitals/Pain Pain Assessment Pain Assessment: No/denies pain Pain Intervention(s): Monitored during session     Extremity/Trunk Assessment Upper Extremity Assessment Upper Extremity Assessment: Overall WFL for tasks assessed   Lower Extremity Assessment Lower Extremity Assessment: Overall WFL for tasks assessed   Cervical / Trunk Assessment Cervical / Trunk Assessment: Normal   Communication Communication Communication: No apparent difficulties   Cognition Arousal: Alert Behavior During Therapy: WFL for tasks assessed/performed Cognition: No apparent impairments                               Following commands: Intact       Cueing  General Comments   Cueing Techniques: Verbal cues  dressing intact   Exercises     Shoulder Instructions      Home Living Family/patient expects to be discharged to:: Private residence Living Arrangements: Spouse/significant  other Available Help at Discharge: Family;Available 24 hours/day Type of Home: House Home Access: Stairs to enter [employer] of Steps: 2 Entrance Stairs-Rails: None Home Layout: One level     Bathroom Shower/Tub: Walk-in shower;Tub/shower unit   Bathroom Toilet: Standard Bathroom Accessibility: Yes   Home Equipment: Agricultural consultant (2 wheels)          Prior Functioning/Environment Prior Level of Function : Independent/Modified Independent;Driving             Mobility Comments: Community ambulation without AD, drives ADLs Comments: Independent    OT Problem List: Decreased knowledge of precautions;Decreased activity tolerance   OT Treatment/Interventions:        OT Goals(Current goals can be found in the care plan section)   Acute Rehab OT Goals Patient Stated Goal: to go home OT Goal  Formulation: With patient Time For Goal Achievement: 03/24/24 Potential to Achieve Goals: Good   OT Frequency:       Co-evaluation              AM-PAC OT 6 Clicks Daily Activity     Outcome Measure Help from another person eating meals?: None Help from another person taking care of personal grooming?: None Help from another person toileting, which includes using toliet, bedpan, or urinal?: None Help from another person bathing (including washing, rinsing, drying)?: None Help from another person to put on and taking off regular upper body clothing?: None Help from another person to put on and taking off regular lower body clothing?: None 6 Click Score: 24   End of Session Nurse Communication: Mobility status  Activity Tolerance: Patient tolerated treatment well Patient left: in bed;with call bell/phone within reach  OT Visit Diagnosis: Other abnormalities of gait and mobility (R26.89)                Time: 9089-9068 OT Time Calculation (min): 21 min Charges:  OT General Charges $OT Visit: 1 Visit OT Evaluation $OT Eval Low Complexity: 1 Low  Destiny Bennett OTR/L Acute Rehabilitation Services Office: [phone]   Verneita ONEIDA Moose 03/17/2024, 10:26 AM

## 2024-03-17 NOTE — Progress Notes (Signed)
 Patient alert and oriented, void, ambulate. Surgical site clean and dry no sign of infection.d/c instructions explain and given to the patient.

## 2024-03-17 NOTE — Discharge Instructions (Signed)
 Wound Care Keep incision covered and dry until post op day 3. You may remove the Honeycomb dressing on post op day 3. Do not put any creams, lotions, or ointments on incision. You are fine to shower. Let water run over incision and pat dry.  Activity Walk each and every day, increasing distance each day. No lifting greater than 5 lbs.  Avoid excessive back motion. No driving for 2 weeks; may ride as a passenger locally.  Diet Resume your normal diet.   Return to Work Will be discussed at your follow up appointment.  Call Your Doctor If Any of These Occur Redness, drainage, or swelling at the wound.  Temperature greater than 101 degrees. Severe pain not relieved by pain medication. Incision starts to come apart.  Follow Up Appt Call [phone] today for appointment in 2-3 weeks if you don't already have one or for any problems.

## 2024-03-19 ENCOUNTER — Encounter (HOSPITAL_COMMUNITY): Payer: Self-pay | Admitting: Neurosurgery

## 2024-03-19 NOTE — Anesthesia Postprocedure Evaluation (Signed)
 Anesthesia Post Note  Patient: Destiny Bennett  Procedure(s) Performed: LUMBAR FIVE KYPHOPLASTY (Back)     Patient location during evaluation: PACU Anesthesia Type: General Level of consciousness: awake and alert Pain management: pain level controlled Vital Signs Assessment: post-procedure vital signs reviewed and stable Respiratory status: spontaneous breathing, nonlabored ventilation and respiratory function stable Cardiovascular status: blood pressure returned to baseline Anesthetic complications: no   No notable events documented.  Last Vitals:  Vitals:   03/17/24 0738 03/17/24 1006  BP: (!) 171/75 (!) 151/81  Pulse: 94 72  Resp: 20   Temp: 37 C   SpO2: 99%     Last Pain:  Vitals:   03/17/24 0858  TempSrc:   PainSc: 2    Pain Goal: Patients Stated Pain Goal: 1 (03/17/24 0813)                 Debby FORBES Like

## 2024-03-21 NOTE — Progress Notes (Signed)
 Intractable pain due to lumbar fracture. No improvement with conservative treatment including bracing.

## 2024-05-01 ENCOUNTER — Encounter (INDEPENDENT_AMBULATORY_CARE_PROVIDER_SITE_OTHER): Payer: Self-pay | Admitting: *Deleted
